# Patient Record
Sex: Female | Born: 1965 | Race: White | Hispanic: No | Marital: Married | Smoking: Current every day smoker
Health system: Southern US, Community
[De-identification: ages and names within clinical notes are randomized; demographics above are authoritative.]

## PROBLEM LIST (undated history)

## (undated) DIAGNOSIS — K8020 Calculus of gallbladder without cholecystitis without obstruction: Principal | ICD-10-CM

## (undated) DIAGNOSIS — R6883 Chills (without fever): Secondary | ICD-10-CM

## (undated) DIAGNOSIS — R509 Fever, unspecified: Secondary | ICD-10-CM

## (undated) DIAGNOSIS — R42 Dizziness and giddiness: Secondary | ICD-10-CM

## (undated) DIAGNOSIS — F419 Anxiety disorder, unspecified: Secondary | ICD-10-CM

## (undated) DIAGNOSIS — F32A Depression, unspecified: Secondary | ICD-10-CM

## (undated) DIAGNOSIS — M1990 Unspecified osteoarthritis, unspecified site: Secondary | ICD-10-CM

## (undated) DIAGNOSIS — F329 Major depressive disorder, single episode, unspecified: Secondary | ICD-10-CM

## (undated) DIAGNOSIS — R112 Nausea with vomiting, unspecified: Secondary | ICD-10-CM

## (undated) DIAGNOSIS — M62830 Muscle spasm of back: Secondary | ICD-10-CM

## (undated) HISTORY — DX: Nausea with vomiting, unspecified: R11.2

## (undated) HISTORY — DX: Anxiety disorder, unspecified: F41.9

## (undated) HISTORY — DX: Dizziness and giddiness: R42

## (undated) HISTORY — DX: Calculus of gallbladder without cholecystitis without obstruction: K80.20

## (undated) HISTORY — PX: TUBAL LIGATION: SHX77

## (undated) HISTORY — DX: Chills (without fever): R68.83

## (undated) HISTORY — DX: Fever, unspecified: R50.9

## (undated) HISTORY — DX: Unspecified osteoarthritis, unspecified site: M19.90

## (undated) HISTORY — DX: Muscle spasm of back: M62.830

## (undated) HISTORY — DX: Depression, unspecified: F32.A

## (undated) HISTORY — DX: Major depressive disorder, single episode, unspecified: F32.9

---
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
## 2002-06-28 ENCOUNTER — Other Ambulatory Visit: Admission: RE | Admit: 2002-06-28 | Discharge: 2002-06-28 | Payer: Self-pay | Admitting: Obstetrics and Gynecology

## 2003-03-14 ENCOUNTER — Encounter: Admission: RE | Admit: 2003-03-14 | Discharge: 2003-03-14 | Payer: Self-pay | Admitting: Family Medicine

## 2004-05-02 ENCOUNTER — Emergency Department (HOSPITAL_COMMUNITY): Admission: EM | Admit: 2004-05-02 | Discharge: 2004-05-02 | Payer: Self-pay | Admitting: Family Medicine

## 2004-05-03 ENCOUNTER — Emergency Department (HOSPITAL_COMMUNITY): Admission: EM | Admit: 2004-05-03 | Discharge: 2004-05-03 | Payer: Self-pay | Admitting: Family Medicine

## 2004-05-06 ENCOUNTER — Encounter: Admission: RE | Admit: 2004-05-06 | Discharge: 2004-05-06 | Payer: Self-pay | Admitting: Family Medicine

## 2007-10-04 ENCOUNTER — Encounter (INDEPENDENT_AMBULATORY_CARE_PROVIDER_SITE_OTHER): Payer: Self-pay | Admitting: Diagnostic Radiology

## 2007-10-04 ENCOUNTER — Encounter: Admission: RE | Admit: 2007-10-04 | Discharge: 2007-10-04 | Payer: Self-pay | Admitting: Obstetrics and Gynecology

## 2008-04-26 ENCOUNTER — Encounter: Admission: RE | Admit: 2008-04-26 | Discharge: 2008-04-26 | Payer: Self-pay | Admitting: Obstetrics and Gynecology

## 2011-01-06 ENCOUNTER — Encounter: Payer: Self-pay | Admitting: *Deleted

## 2011-01-06 ENCOUNTER — Emergency Department (HOSPITAL_COMMUNITY)
Admission: EM | Admit: 2011-01-06 | Discharge: 2011-01-07 | Discharge status: Against Medical Advice | Payer: 59 | Attending: Emergency Medicine | Admitting: Emergency Medicine

## 2011-01-06 ENCOUNTER — Emergency Department (HOSPITAL_COMMUNITY): Payer: 59

## 2011-01-06 ENCOUNTER — Other Ambulatory Visit: Payer: Self-pay

## 2011-01-06 DIAGNOSIS — R079 Chest pain, unspecified: Secondary | ICD-10-CM | POA: Insufficient documentation

## 2011-01-06 MED ORDER — ASPIRIN 325 MG PO TABS: 325.0000 mg | ORAL_TABLET | ORAL | Status: DC

## 2011-01-06 NOTE — ED Notes (Signed)
To ed for eval of midsternal cp. Pain is worse with moving. States she also has spasms in her back. Vomiting last night. Skin w/d, resp e/u. Pt states if she is laying curled up that her pain is better than if she is laying flat.

## 2011-01-06 NOTE — ED Notes (Signed)
Pt left AMA °

## 2011-01-09 ENCOUNTER — Other Ambulatory Visit: Payer: Self-pay | Admitting: Gastroenterology

## 2011-01-09 DIAGNOSIS — R109 Unspecified abdominal pain: Secondary | ICD-10-CM

## 2011-01-10 ENCOUNTER — Ambulatory Visit
Admission: RE | Admit: 2011-01-10 | Discharge: 2011-01-10 | Disposition: A | Discharge status: Home / Self Care | Payer: 59 | Source: Ambulatory Visit | Attending: Gastroenterology | Admitting: Gastroenterology

## 2011-01-10 DIAGNOSIS — R109 Unspecified abdominal pain: Secondary | ICD-10-CM

## 2011-01-14 ENCOUNTER — Ambulatory Visit (INDEPENDENT_AMBULATORY_CARE_PROVIDER_SITE_OTHER): Payer: 59 | Admitting: Surgery

## 2011-01-14 ENCOUNTER — Encounter (INDEPENDENT_AMBULATORY_CARE_PROVIDER_SITE_OTHER): Payer: Self-pay | Admitting: General Surgery

## 2011-01-14 ENCOUNTER — Inpatient Hospital Stay (HOSPITAL_COMMUNITY)
Admission: AD | Admit: 2011-01-14 | Discharge: 2011-01-17 | DRG: 419 | Disposition: A | Discharge status: Home / Self Care | Payer: 59 | Source: Ambulatory Visit | Attending: Surgery | Admitting: Surgery

## 2011-01-14 ENCOUNTER — Encounter (INDEPENDENT_AMBULATORY_CARE_PROVIDER_SITE_OTHER): Payer: Self-pay | Admitting: Surgery

## 2011-01-14 VITALS — BP 126/82 | HR 68 | Temp 97.9°F | Resp 16 | Ht 65.0 in | Wt 190.6 lb

## 2011-01-14 DIAGNOSIS — K8020 Calculus of gallbladder without cholecystitis without obstruction: Secondary | ICD-10-CM

## 2011-01-14 DIAGNOSIS — K8000 Calculus of gallbladder with acute cholecystitis without obstruction: Principal | ICD-10-CM | POA: Diagnosis present

## 2011-01-14 HISTORY — DX: Calculus of gallbladder without cholecystitis without obstruction: K80.20

## 2011-01-14 LAB — DIFFERENTIAL
Basophils Relative: 1 % (ref 0–1)
Eosinophils Absolute: 0.5 10*3/uL (ref 0.0–0.7)
Monocytes Absolute: 0.8 10*3/uL (ref 0.1–1.0)
Monocytes Relative: 7 % (ref 3–12)

## 2011-01-14 LAB — COMPREHENSIVE METABOLIC PANEL
AST: 22 U/L (ref 0–37)
Albumin: 3.5 g/dL (ref 3.5–5.2)
Alkaline Phosphatase: 85 U/L (ref 39–117)
Chloride: 100 mEq/L (ref 96–112)
Potassium: 3.1 mEq/L — ABNORMAL LOW (ref 3.5–5.1)
Sodium: 136 mEq/L (ref 135–145)
Total Bilirubin: 0.2 mg/dL — ABNORMAL LOW (ref 0.3–1.2)

## 2011-01-14 LAB — CBC
Platelets: 447 10*3/uL — ABNORMAL HIGH (ref 150–400)
RDW: 13.2 % (ref 11.5–15.5)
WBC: 12.3 10*3/uL — ABNORMAL HIGH (ref 4.0–10.5)

## 2011-01-14 MED ORDER — DEXTROSE 5 % IV SOLN
2.0000 g | Freq: Three times a day (TID) | INTRAVENOUS | Status: AC
  Administered 2011-01-14 – 2011-01-15 (×2): 2 g via INTRAVENOUS
  Filled 2011-01-14: qty 2

## 2011-01-14 MED ORDER — MORPHINE SULFATE 2 MG/ML IJ SOLN
2.0000 mg | INTRAMUSCULAR | Status: DC | PRN
  Administered 2011-01-14 – 2011-01-16 (×2): 2 mg via INTRAVENOUS
  Filled 2011-01-14 (×3): qty 1

## 2011-01-14 NOTE — Patient Instructions (Signed)
We will admit you to [facility] today for surgery to remove your gallbladder tomorrow. We want to start some antibiotics today

## 2011-01-14 NOTE — Progress Notes (Addendum)
Patient ID: Rebecca Wyatt, female   DOB: [birth year], 45 y.o.   MRN: [id number] Referred by Dr Dulce Sellar  Chief Complaint  Patient presents with  . Other    Eval gallbladder    HPI Rebecca Wyatt is a 45 y.o. female.  Dr Dulce Sellar has asked me to see her for gallstones. Her history is noted in his office notes, but she had significant abd pain and was found to have gallstones with an elevated WBC. There was a question of mimimal thickening of the gall bladder but a negatvie ultrasonic Murphy's was noted. She has continued woth pain and nausea. She says she was tender when the ultrasound probe was over the RUX. She has not have fever, chills or juandice. He pain continues today and she has been limiting her PO intake to avoid nauseaHPI  Past Medical History  Diagnosis Date  . Arthritis   . Abdominal pain   . Chills   . Fever   . Back spasm   . Nausea & vomiting   . Dizziness   . Light-headedness   . Gallstones 01/14/2011    Past Surgical History  Procedure Date  . Appendectomy 1982    Family History  Problem Relation Age of Onset  . COPD Mother   . Heart disease Mother   . Cancer Father     lung  . Cancer Maternal Aunt     bone, breast  . Cancer Maternal Uncle     lung  . Cancer Paternal Grandmother     breast    Social History History  Substance Use Topics  . Smoking status: Current Everyday Smoker -- 0.5 packs/day  . Smokeless tobacco: Never Used  . Alcohol Use: No    No Known Allergies  Current Outpatient Prescriptions  Medication Sig Dispense Refill  . HYDROcodone-acetaminophen (VICODIN) 5-500 MG per tablet Take 1 tablet by mouth every 6 (six) hours as needed.        Marland Kitchen alum & mag hydroxide-simeth (MAALOX/MYLANTA) 200-200-20 MG/5ML suspension Take 5 mLs by mouth every 6 (six) hours as needed. For upset stomach       . cyclobenzaprine (FLEXERIL) 10 MG tablet Take 10 mg by mouth 3 (three) times daily as needed. For muscle spasms        . omeprazole (PRILOSEC) 20  MG capsule Take 20 mg by mouth daily.        . ranitidine (ZANTAC) 75 MG tablet Take 75 mg by mouth 2 (two) times daily.          Review of Systems Review of Systems  Constitutional: Negative for fever, chills and unexpected weight change.  HENT: Negative for hearing loss, congestion, sore throat, trouble swallowing and voice change.   Eyes: Negative for visual disturbance.  Respiratory: Negative for cough and wheezing.   Cardiovascular: Negative for chest pain, palpitations and leg swelling.  Gastrointestinal: Positive for nausea, vomiting and abdominal pain. Negative for diarrhea, constipation, blood in stool, abdominal distention and anal bleeding.  Genitourinary: Negative for hematuria, vaginal bleeding and difficulty urinating.  Musculoskeletal: Negative for arthralgias.  Skin: Negative for rash and wound.  Neurological: Negative for seizures, syncope and headaches.  Hematological: Negative for adenopathy. Does not bruise/bleed easily.  Psychiatric/Behavioral: Negative for confusion.    Blood pressure 126/82, pulse 68, temperature 97.9 F (36.6 C), temperature source Temporal, resp. rate 16, height 5\' 5"  (1.651 m), weight 190 lb 9.6 oz (86.456 kg).  Physical Exam Physical Exam  Constitutional: She is oriented to person, place, and  time. She appears well-developed and well-nourished. No distress.  HENT:  Head: Normocephalic and atraumatic.  Mouth/Throat: Oropharynx is clear and moist.  Eyes: EOM are normal. Pupils are equal, round, and reactive to light. Right eye exhibits no discharge. No scleral icterus.  Neck: Normal range of motion. Neck supple. No JVD present. No tracheal deviation present. No thyromegaly present.  Cardiovascular: Normal rate, regular rhythm, normal heart sounds and intact distal pulses.  Exam reveals no gallop and no friction rub.   No murmur heard. Pulmonary/Chest: Effort normal and breath sounds normal. No respiratory distress. She has no wheezes. She has  no rales.  Abdominal: Bowel sounds are normal. She exhibits no distension. There is tenderness. There is guarding.  Musculoskeletal: She exhibits no edema and no tenderness.  Neurological: She is alert and oriented to person, place, and time.  Skin: Skin is dry. She is not diaphoretic. No erythema. No pallor.  Psychiatric: She has a normal mood and affect. Her behavior is normal.  She is very tender in the RUQ, no rebound  Data Reviewed Notes from Dr Dulce Sellar, the labs and sono note  Assessment    Acute calculous cholecystitis    Plan    Believe she needs to be admitted and undergo cholecystectomy.       Molly Maselli J 01/14/2011, 3:31 PM

## 2011-01-15 ENCOUNTER — Inpatient Hospital Stay (HOSPITAL_COMMUNITY): Payer: 59

## 2011-01-15 ENCOUNTER — Encounter (HOSPITAL_COMMUNITY): Payer: Self-pay | Admitting: Anesthesiology

## 2011-01-15 ENCOUNTER — Inpatient Hospital Stay (HOSPITAL_COMMUNITY): Payer: 59 | Admitting: Anesthesiology

## 2011-01-15 ENCOUNTER — Encounter (HOSPITAL_COMMUNITY): Admission: AD | Disposition: A | Discharge status: Home / Self Care | Payer: Self-pay | Source: Ambulatory Visit

## 2011-01-15 DIAGNOSIS — K8000 Calculus of gallbladder with acute cholecystitis without obstruction: Secondary | ICD-10-CM

## 2011-01-15 HISTORY — PX: CHOLECYSTECTOMY: SHX55

## 2011-01-15 LAB — SURGICAL PCR SCREEN
MRSA, PCR: INVALID — AB
Staphylococcus aureus: INVALID — AB

## 2011-01-15 SURGERY — LAPAROSCOPIC CHOLECYSTECTOMY WITH INTRAOPERATIVE CHOLANGIOGRAM
Anesthesia: General | Site: Abdomen | Wound class: Clean Contaminated

## 2011-01-15 MED ORDER — ONDANSETRON HCL 4 MG/2ML IJ SOLN
INTRAMUSCULAR | Status: DC | PRN
  Administered 2011-01-15: 4 mg via INTRAVENOUS

## 2011-01-15 MED ORDER — ACETAMINOPHEN 325 MG PO TABS: 650.0000 mg | ORAL_TABLET | ORAL | Status: DC | PRN

## 2011-01-15 MED ORDER — LACTATED RINGERS IV SOLN
INTRAVENOUS | Status: DC
  Administered 2011-01-15 (×2): via INTRAVENOUS
  Administered 2011-01-15: 1000 mL via INTRAVENOUS

## 2011-01-15 MED ORDER — CHLORHEXIDINE GLUCONATE CLOTH 2 % EX PADS
6.0000 | MEDICATED_PAD | Freq: Every day | CUTANEOUS | Status: DC
  Administered 2011-01-16: 6 via TOPICAL

## 2011-01-15 MED ORDER — KETOROLAC TROMETHAMINE 30 MG/ML IJ SOLN
15.0000 mg | Freq: Once | INTRAMUSCULAR | Status: AC | PRN
  Administered 2011-01-15: 30 mg via INTRAVENOUS

## 2011-01-15 MED ORDER — ACETAMINOPHEN 10 MG/ML IV SOLN
INTRAVENOUS | Status: DC | PRN
  Administered 2011-01-15: 1000 mg via INTRAVENOUS

## 2011-01-15 MED ORDER — LACTATED RINGERS IV SOLN
INTRAVENOUS | Status: DC | PRN
  Administered 2011-01-15: 1000 mL via INTRAVENOUS

## 2011-01-15 MED ORDER — MIDAZOLAM HCL 5 MG/5ML IJ SOLN
INTRAMUSCULAR | Status: DC | PRN
  Administered 2011-01-15: 2 mg via INTRAVENOUS

## 2011-01-15 MED ORDER — PIPERACILLIN-TAZOBACTAM 3.375 G IVPB
3.3750 g | Freq: Three times a day (TID) | INTRAVENOUS | Status: DC
  Administered 2011-01-15 – 2011-01-17 (×6): 3.375 g via INTRAVENOUS
  Filled 2011-01-15 (×9): qty 50

## 2011-01-15 MED ORDER — KCL IN DEXTROSE-NACL 20-5-0.45 MEQ/L-%-% IV SOLN
INTRAVENOUS | Status: DC
  Administered 2011-01-15 – 2011-01-16 (×2): via INTRAVENOUS
  Filled 2011-01-15 (×6): qty 1000

## 2011-01-15 MED ORDER — BUPIVACAINE-EPINEPHRINE 0.25% -1:200000 IJ SOLN
INTRAMUSCULAR | Status: DC | PRN
  Administered 2011-01-15: 20 mL

## 2011-01-15 MED ORDER — FENTANYL CITRATE 0.05 MG/ML IJ SOLN: 25.0000 ug | INTRAMUSCULAR | Status: DC | PRN

## 2011-01-15 MED ORDER — DEXAMETHASONE SODIUM PHOSPHATE 10 MG/ML IJ SOLN
INTRAMUSCULAR | Status: DC | PRN
  Administered 2011-01-15: 10 mg via INTRAVENOUS

## 2011-01-15 MED ORDER — ONDANSETRON HCL 4 MG PO TABS: 4.0000 mg | ORAL_TABLET | Freq: Four times a day (QID) | ORAL | Status: DC | PRN

## 2011-01-15 MED ORDER — CISATRACURIUM BESYLATE 2 MG/ML IV SOLN
INTRAVENOUS | Status: DC | PRN
  Administered 2011-01-15: 2 mg via INTRAVENOUS

## 2011-01-15 MED ORDER — HYDROCODONE-ACETAMINOPHEN 5-325 MG PO TABS
1.0000 | ORAL_TABLET | ORAL | Status: DC | PRN
  Administered 2011-01-16: 2 via ORAL
  Administered 2011-01-17 (×3): 1 via ORAL
  Filled 2011-01-15: qty 2
  Filled 2011-01-15: qty 1
  Filled 2011-01-15: qty 2

## 2011-01-15 MED ORDER — NEOSTIGMINE METHYLSULFATE 1 MG/ML IJ SOLN
INTRAMUSCULAR | Status: DC | PRN
  Administered 2011-01-15: 4 mg via INTRAVENOUS
  Administered 2011-01-15: 1 mg via INTRAVENOUS

## 2011-01-15 MED ORDER — FENTANYL CITRATE 0.05 MG/ML IJ SOLN
INTRAMUSCULAR | Status: DC | PRN
  Administered 2011-01-15: 50 ug via INTRAVENOUS
  Administered 2011-01-15 (×2): 100 ug via INTRAVENOUS
  Administered 2011-01-15: 50 ug via INTRAVENOUS

## 2011-01-15 MED ORDER — MORPHINE SULFATE 2 MG/ML IJ SOLN
2.0000 mg | INTRAMUSCULAR | Status: DC | PRN
  Administered 2011-01-15 – 2011-01-16 (×11): 2 mg via INTRAVENOUS
  Filled 2011-01-15 (×10): qty 1

## 2011-01-15 MED ORDER — GLYCOPYRROLATE 0.2 MG/ML IJ SOLN
INTRAMUSCULAR | Status: DC | PRN
  Administered 2011-01-15: .6 mg via INTRAVENOUS

## 2011-01-15 MED ORDER — ONDANSETRON HCL 4 MG/2ML IJ SOLN
4.0000 mg | Freq: Four times a day (QID) | INTRAMUSCULAR | Status: DC | PRN
  Administered 2011-01-15: 4 mg via INTRAVENOUS
  Filled 2011-01-15: qty 2

## 2011-01-15 MED ORDER — HYDROMORPHONE HCL PF 1 MG/ML IJ SOLN
INTRAMUSCULAR | Status: DC | PRN
  Administered 2011-01-15: 1 mg via INTRAVENOUS
  Administered 2011-01-15: 0.5 mg via INTRAVENOUS
  Administered 2011-01-15: 1 mg via INTRAVENOUS
  Administered 2011-01-15: 0.5 mg via INTRAVENOUS
  Administered 2011-01-15: 1 mg via INTRAVENOUS

## 2011-01-15 MED ORDER — ROCURONIUM BROMIDE 100 MG/10ML IV SOLN
INTRAVENOUS | Status: DC | PRN
  Administered 2011-01-15: 10 mg via INTRAVENOUS
  Administered 2011-01-15: 5 mg via INTRAVENOUS
  Administered 2011-01-15: 10 mg via INTRAVENOUS
  Administered 2011-01-15: 40 mg via INTRAVENOUS

## 2011-01-15 MED ORDER — LIDOCAINE HCL (CARDIAC) 20 MG/ML IV SOLN
INTRAVENOUS | Status: DC | PRN
  Administered 2011-01-15: 50 mg via INTRAVENOUS

## 2011-01-15 MED ORDER — MUPIROCIN 2 % EX OINT
1.0000 "application " | TOPICAL_OINTMENT | Freq: Two times a day (BID) | CUTANEOUS | Status: DC
  Administered 2011-01-16 (×2): 1 via NASAL
  Filled 2011-01-15 (×2): qty 22

## 2011-01-15 MED ORDER — IOHEXOL 300 MG/ML  SOLN
INTRAMUSCULAR | Status: DC | PRN
  Administered 2011-01-15 (×2): 6 mL via INTRAVENOUS

## 2011-01-15 MED ORDER — PROPOFOL 10 MG/ML IV BOLUS
INTRAVENOUS | Status: DC | PRN
  Administered 2011-01-15: 20 mg via INTRAVENOUS
  Administered 2011-01-15: 180 mg via INTRAVENOUS

## 2011-01-15 SURGICAL SUPPLY — 44 items
APL SKNCLS STERI-STRIP NONHPOA (GAUZE/BANDAGES/DRESSINGS) ×1
APPLIER CLIP ROT 10 11.4 M/L (STAPLE) ×2
APR CLP MED LRG 11.4X10 (STAPLE) ×1
BAG SPEC RTRVL LRG 6X4 10 (ENDOMECHANICALS) ×1
BENZOIN TINCTURE PRP APPL 2/3 (GAUZE/BANDAGES/DRESSINGS) ×2 IMPLANT
CANISTER SUCTION 2500CC (MISCELLANEOUS) ×2 IMPLANT
CLIP APPLIE ROT 10 11.4 M/L (STAPLE) ×1 IMPLANT
CLOSURE STERI STRIP 1/2 X4 (GAUZE/BANDAGES/DRESSINGS) ×1 IMPLANT
CLOTH BEACON ORANGE TIMEOUT ST (SAFETY) ×2 IMPLANT
COVER MAYO STAND STRL (DRAPES) ×2 IMPLANT
DECANTER SPIKE VIAL GLASS SM (MISCELLANEOUS) ×2 IMPLANT
DRAIN CHANNEL RND F F (WOUND CARE) ×1 IMPLANT
DRAPE C-ARM 42X72 X-RAY (DRAPES) ×2 IMPLANT
DRAPE LAPAROSCOPIC ABDOMINAL (DRAPES) ×2 IMPLANT
ELECT REM PT RETURN 9FT ADLT (ELECTROSURGICAL) ×2
ELECTRODE REM PT RTRN 9FT ADLT (ELECTROSURGICAL) ×1 IMPLANT
EVACUATOR DRAINAGE 7X20 100CC (MISCELLANEOUS) IMPLANT
EVACUATOR SILICONE 100CC (MISCELLANEOUS) ×2
GAUZE SPONGE 4X4 12PLY STRL LF (GAUZE/BANDAGES/DRESSINGS) ×1 IMPLANT
GLOVE BIOGEL PI IND STRL 7.0 (GLOVE) ×1 IMPLANT
GLOVE BIOGEL PI INDICATOR 7.0 (GLOVE) ×1
GLOVE ORTHO TXT STRL SZ7.5 (GLOVE) ×2 IMPLANT
GOWN PREVENTION PLUS XLARGE (GOWN DISPOSABLE) ×2 IMPLANT
GOWN STRL NON-REIN LRG LVL3 (GOWN DISPOSABLE) ×2 IMPLANT
GOWN STRL REIN XL XLG (GOWN DISPOSABLE) ×2 IMPLANT
HEMOSTAT SNOW SURGICEL 2X4 (HEMOSTASIS) ×1 IMPLANT
HEMOSTAT SURGICEL 4X8 (HEMOSTASIS) IMPLANT
IV NS 1000ML (IV SOLUTION) ×6
IV NS 1000ML BAXH (IV SOLUTION) ×3 IMPLANT
KIT BASIN OR (CUSTOM PROCEDURE TRAY) ×2 IMPLANT
NS IRRIG 1000ML POUR BTL (IV SOLUTION) IMPLANT
POUCH SPECIMEN RETRIEVAL 10MM (ENDOMECHANICALS) ×2 IMPLANT
SET CHOLANGIOGRAPH MIX (MISCELLANEOUS) ×2 IMPLANT
SET IRRIG TUBING LAPAROSCOPIC (IRRIGATION / IRRIGATOR) ×2 IMPLANT
SOLUTION ANTI FOG 6CC (MISCELLANEOUS) ×2 IMPLANT
STRIP CLOSURE SKIN 1/2X4 (GAUZE/BANDAGES/DRESSINGS) ×2 IMPLANT
SUT VIC AB 0 UR5 27 (SUTURE) IMPLANT
SUT VIC AB 4-0 PS2 27 (SUTURE) ×2 IMPLANT
TAPE HYPAFIX 4 X10 (GAUZE/BANDAGES/DRESSINGS) ×1 IMPLANT
TRAY LAP CHOLE (CUSTOM PROCEDURE TRAY) ×2 IMPLANT
TROCAR BLADELESS OPT 5 75 (ENDOMECHANICALS) ×4 IMPLANT
TROCAR XCEL BLUNT TIP 100MML (ENDOMECHANICALS) ×2 IMPLANT
TROCAR XCEL NON-BLD 11X100MML (ENDOMECHANICALS) ×2 IMPLANT
TUBING INSUFFLATION 10FT LAP (TUBING) ×2 IMPLANT

## 2011-01-15 NOTE — Anesthesia Postprocedure Evaluation (Signed)
  Anesthesia Post-op Note  Patient: Rebecca Wyatt  Procedure(s) Performed:  LAPAROSCOPIC CHOLECYSTECTOMY WITH INTRAOPERATIVE CHOLANGIOGRAM  Patient Location: PACU  Anesthesia Type: General  Level of Consciousness: awake and alert   Airway and Oxygen Therapy: Patient Spontanous Breathing  Post-op Pain: mild  Post-op Assessment: Post-op Vital signs reviewed, Patient's Cardiovascular Status Stable, Respiratory Function Stable, Patent Airway and No signs of Nausea or vomiting  Post-op Vital Signs: stable  Complications: No apparent anesthesia complications

## 2011-01-15 NOTE — Preoperative (Signed)
Beta Blockers   Reason not to administer Beta Blockers:Not Applicable 

## 2011-01-15 NOTE — Anesthesia Procedure Notes (Signed)
Procedures

## 2011-01-15 NOTE — Brief Op Note (Signed)
01/14/2011 - 01/15/2011  3:49 PM  PATIENT:  Arn Medal  45 y.o. female  PRE-OPERATIVE DIAGNOSIS:  accute cholelithiasis  POST-OPERATIVE DIAGNOSIS:  accute cholelithiasis  PROCEDURE:  Procedure(s): LAPAROSCOPIC CHOLECYSTECTOMY WITH INTRAOPERATIVE CHOLANGIOGRAM  SURGEON:  Surgeon(s): Iona Coach, MD  PHYSICIAN ASSISTANT: Brayton El  ASSISTANTS: none   ANESTHESIA:   general  EBL:  Total I/O In: 2400 [I.V.:2300; IV Piggyback:100] Out: -   BLOOD ADMINISTERED:none  DRAINS: (19) Blake drain(s) in the subhepatic fossa   LOCAL MEDICATIONS USED:  MARCAINE 20CC  SPECIMEN:  Excision  DISPOSITION OF SPECIMEN:  PATHOLOGY  COUNTS:  YES  TOURNIQUET:  * No tourniquets in log *  DICTATION: .Other Dictation: Dictation Number [id number]  PLAN OF CARE: Admit for overnight observation  PATIENT DISPOSITION:  PACU - hemodynamically stable.

## 2011-01-15 NOTE — Anesthesia Preprocedure Evaluation (Addendum)
Anesthesia Evaluation  Patient identified by MRN, date of birth, ID band Patient awake    Reviewed: Allergy & Precautions, H&P , NPO status , Patient's Chart, lab work & pertinent test results  Airway Mallampati: II TM Distance: >3 FB Neck ROM: Full    Dental No notable dental hx. (+) Dental Advisory Given,    Pulmonary neg pulmonary ROS,  clear to auscultation  Pulmonary exam normal       Cardiovascular neg cardio ROS Regular Normal    Neuro/Psych Negative Neurological ROS  Negative Psych ROS   GI/Hepatic negative GI ROS, Neg liver ROS, GERD-  ,  Endo/Other  Negative Endocrine ROS  Renal/GU negative Renal ROS  Genitourinary negative   Musculoskeletal negative musculoskeletal ROS (+)   Abdominal   Peds negative pediatric ROS (+)  Hematology negative hematology ROS (+)   Anesthesia Other Findings   Reproductive/Obstetrics negative OB ROS                          Anesthesia Physical Anesthesia Plan  ASA: II  Anesthesia Plan: General   Post-op Pain Management:    Induction: Intravenous  Airway Management Planned: Oral ETT  Additional Equipment:   Intra-op Plan:   Post-operative Plan: Extubation in OR  Informed Consent: I have reviewed the patients History and Physical, chart, labs and discussed the procedure including the risks, benefits and alternatives for the proposed anesthesia with the patient or authorized representative who has indicated his/her understanding and acceptance.   Dental advisory given  Plan Discussed with: CRNA  Anesthesia Plan Comments:         Anesthesia Quick Evaluation

## 2011-01-15 NOTE — Transfer of Care (Signed)
Immediate Anesthesia Transfer of Care Note  Patient: Rebecca Wyatt  Procedure(s) Performed:  LAPAROSCOPIC CHOLECYSTECTOMY WITH INTRAOPERATIVE CHOLANGIOGRAM  Patient Location: PACU  Anesthesia Type: General  Level of Consciousness: sedated, patient cooperative, lethargic and responds to stimulation  Airway & Oxygen Therapy: Patient Spontanous Breathing and Patient connected to face mask oxygen  Post-op Assessment: Report given to PACU RN, Post -op Vital signs reviewed and stable and Patient moving all extremities X 4  Post vital signs: Reviewed and stable  Complications: No apparent anesthesia complications

## 2011-01-15 NOTE — Progress Notes (Signed)
  Subjective: Cholecystitis npo or this am pt agrees. Permit signed  Objective: Vital signs in last 24 hours: Temp:  [97.9 F (36.6 C)-98.2 F (36.8 C)] 98.2 F (36.8 C) (12/12 0648) Pulse Rate:  [68-91] 86  (12/12 0648) Resp:  [16-18] 18  (12/12 0648) BP: (107-129)/(59-82) 112/59 mmHg (12/12 0648) SpO2:  [96 %-99 %] 96 % (12/12 0648) Weight:  [189 lb 2.5 oz (85.8 kg)-190 lb 9.6 oz (86.456 kg)] 189 lb 2.5 oz (85.8 kg) (12/11 1851) Last BM Date: 01/14/11  Intake/Output this shift:    Physical Exam: BP 112/59  Pulse 86  Temp(Src) 98.2 F (36.8 C) (Oral)  Resp 18  Ht 5\' 5"  (1.651 m)  Wt 189 lb 2.5 oz (85.8 kg)  BMI 31.48 kg/m2  SpO2 96% Mild tenderness RUQ  Labs: CBC  Basename 01/14/11 2008  WBC 12.3*  HGB 14.2  HCT 41.9  PLT 447*   BMET  Basename 01/14/11 2008  NA 136  K 3.1*  CL 100  CO2 25  GLUCOSE 93  BUN 8  CREATININE 0.71  CALCIUM 10.2   LFT  Basename 01/14/11 2008  PROT 8.1  ALBUMIN 3.5  AST 22  ALT 26  ALKPHOS 85  BILITOT 0.2*  BILIDIR --  IBILI --  LIPASE 35   PT/INR No results found for this basename: LABPROT:2,INR:2 in the last 72 hours ABG No results found for this basename: PHART:2,PCO2:2,PO2:2,HCO3:2 in the last 72 hours  Studies/Results: No results found.  Assessment: Active Problems:  * No active hospital problems. *    Procedure(s): LAPAROSCOPIC CHOLECYSTECTOMY WITH INTRAOPERATIVE CHOLANGIOGRAM  Plan:  or this am plan cholangiogram  LOS: 1 day    Sandra Tellefsen J 01/15/2011

## 2011-01-16 ENCOUNTER — Other Ambulatory Visit (INDEPENDENT_AMBULATORY_CARE_PROVIDER_SITE_OTHER): Payer: Self-pay | Admitting: General Surgery

## 2011-01-16 ENCOUNTER — Encounter (HOSPITAL_COMMUNITY): Payer: Self-pay | Admitting: General Surgery

## 2011-01-16 LAB — CBC
HCT: 36.7 % (ref 36.0–46.0)
MCH: 26.6 pg (ref 26.0–34.0)
MCV: 82.7 fL (ref 78.0–100.0)
Platelets: 414 10*3/uL — ABNORMAL HIGH (ref 150–400)
RBC: 4.44 MIL/uL (ref 3.87–5.11)

## 2011-01-16 LAB — COMPREHENSIVE METABOLIC PANEL
AST: 47 U/L — ABNORMAL HIGH (ref 0–37)
Albumin: 2.8 g/dL — ABNORMAL LOW (ref 3.5–5.2)
Alkaline Phosphatase: 70 U/L (ref 39–117)
Chloride: 99 mEq/L (ref 96–112)
Potassium: 3.8 mEq/L (ref 3.5–5.1)
Total Bilirubin: 0.2 mg/dL — ABNORMAL LOW (ref 0.3–1.2)

## 2011-01-16 MED ORDER — METHOCARBAMOL 500 MG PO TABS
500.0000 mg | ORAL_TABLET | Freq: Four times a day (QID) | ORAL | Status: DC | PRN
  Administered 2011-01-16 – 2011-01-17 (×2): 500 mg via ORAL
  Filled 2011-01-16 (×2): qty 1

## 2011-01-16 NOTE — Op Note (Signed)
NAMEABBIE, BERLING NO.:  [id number]  MEDICAL RECORD NO.:  [id number]  LOCATION:  1510                         FACILITY:  [facility]  PHYSICIAN:  Anselm Pancoast. Shahad Mazurek, M.D.DATE OF BIRTH:  February 04, 1966  DATE OF PROCEDURE:  01/15/2011 DATE OF DISCHARGE:                              OPERATIVE REPORT   PREOPERATIVE DIAGNOSIS:  Acute cholecystitis with stones.  POSTOPERATIVE DIAGNOSES: 1. Acute cholecystitis with stones. 2. Negative cholangiogram.  OPERATION:  Laparoscopic cholecystectomy with cholangiogram.  ANESTHESIA:  General anesthesia.  SURGEON:  Anselm Pancoast. Zachery Dakins, M.D.  ASSISTANT:  Festus Barren.  HISTORY:  The patient is a 45 year old female who was seen in the Urgent Office yesterday by Dr. Jamey Ripa and gave a history that she had significant abdominal pain last week.  She saw a physician and I think that they did a white count that was markedly elevated, but the diagnosis of acute cholecystitis was not made until she had an ultrasound of the gallbladder, I think on Monday.  They referred her to our Urgent Clinic.  She was seen on Tuesday and Dr. Jamey Ripa found that she was definitely tender in the right upper quadrant, and arranged for her to be admitted at [facility] and start on IV fluids, antibiotics. He had contacted me about adding her to the OR schedule for the day, and this morning when I examined her, she was definitely tender in the right upper quadrant.  She was not febrile and understood that we will hopefully be able to do this with the laparoscope.  The patient has had a previous tubal procedure of some sort, but not actually truly had a upper abdominal surgery and was in agreement for the surgery.  She was taken down to __________ gallbladder today and the time-out was completed.  She was induced general anesthesia by Dr. Jenean Lindau by endotracheal tube, oral tube into the stomach, and then the abdomen was prepped with Betadine solution and  draped in a sterile manner.  Time-out was completed and appropriate documentation, etc., were done and the antibiotics were being administered and she has PAS stockings.  After this was completed, a small incision was made below the umbilicus.  She has had a previous laparoscopic __________ GYN procedure and we carefully dissected down through the fascia.  She is about 190 pounds and opened the fascia using tip of my fingers __________ by work into the right side.  I could identify the free peritoneal cavity.  A pursestring suture of 0 Vicryl was placed and a Hasson cannula inserted. The camera inserted and CO2 was infused.  The gallbladder, you could see the gallbladder, which you could see a big inflammatory mass in the subhepatic area.  There was no evidence of any generalized peritonitis, etc., and the upper 10 mm trocar was placed in the subxiphoid to the right side, 2 lateral 5 mm trocars were placed at the appropriate level of position.  We spent the next hour trying to basically free up the omentum that was very adherent and kind of carefully working down on the gallbladder and had switched to a 30-degree scope, and then at the more proximal portion of the gallbladder, things were __________.  I  had aspirated the bile from the gallbladder with a __________ aspirator, but still __________decompressed it was very difficult to get a grasper to actually grab this markedly inflamed hemorrhagic, nearly necrotic gallbladder.  The proximal portion medially very carefully dissected and then at the most deepened portion, you could visualize what looks like the cystic duct, kind of curved back on itself and I was able to encompass it after I kind of freed up the attachments to the liver laterally and then I could get a right-angle around the cystic duct. There were numerous little vessels that were not really a large major cystic duct or the cystic artery at all, and we put clips once of  any size and we cauterized the little areas that were probably lymphatic.  I then did a cholangiogram and the cholangiogram shows that she has got about a 3 cm cystic duct, good flow into the duodenum.  No evidence of any common duct stones and we then removed the catheter, dissected with about a centimeter more proximal, since we knew the structures and could put 3 clips on the proximal cystic duct, then divided it.  I then kind of teased the gallbladder inferiorly, being very cautious since we had not actually clipped or ligated a significant cystic artery, and then we could start get it in the septic areas and the gallbladder itself was just basically obliterated with its attachment to the liver and then a couple areas we actually got into the gallbladder because it had undergone intrahepatic, but since we had aspirated the bile.  There was very little bile spillage, that the stones were big, so there was not any dropped stones.  We were finally able to free up the gallbladder completely using predominantly the __________ and then placed the gallbladder in the EndoCatch bag.  I then thoroughly irrigated and aspirated the cystic duct.  Clamps were in good position.  No evidence of any significant bleeding.  A piece of snow was placed and then we placed a piece of snow Surgicel product actually in the gallbladder fossa and then switched the camera to the upper tomb of the __________tying the gallbladder, pulled it up, flushed with a abdominal wall.  The stones were large enough and __________ crushed stones trying to open the gallbladder.  After we had opened it up, cystic duct, __________ easily and then we could pull the stones out and the gallbladder within the bag and set her __________ spillage.  I then replaced the pursestring suture __________ we transected the little pursestring and then reinserted the __________.  Hemostasis appeared good.  The irrigating fluid was aspirated and  brought a big 19 Blake drain laterally through the most lateral incision and placed up in the fossa with the gallbladder.  The [place] drain is under the liver and brought out laterally, and sutured to the skin and then after reinspected, it was deemed satisfactory.  I removed the umbilical Hasson cannula.  I put an additional figure-of-8, tied all __________ sutures there and then anesthetized the fascia at the umbilicus.  The 5 mm port was withdrawn under direct vision and the upper 10 mm trocar withdrawn. __________ keep her on antibiotics for about 24 hours and check liver function studies in the morning.  The Blake drains __________ 4 days ago.  The patient did not have any bile drainage from the [place] since. I think we got 3 clips on the cystic duct that was not acutely inflamed like the gallbladder was.  Anselm Pancoast. Zachery Dakins, M.D.     WJW/MEDQ  D:  01/15/2011  T:  01/15/2011  Job:  [id number]

## 2011-01-16 NOTE — Progress Notes (Signed)
INITIAL ADULT NUTRITION ASSESSMENT Date: 01/16/2011   Time: 10:32 AM Reason for Assessment: Nutrition risk   ASSESSMENT: Female 45 y.o.  Dx: Acute cholecystitis  Hx:  Past Medical History  Diagnosis Date  . Arthritis   . Abdominal pain   . Chills   . Fever   . Back spasm   . Nausea & vomiting   . Dizziness   . Light-headedness   . Gallstones 01/14/2011   Related Meds:  Scheduled Meds:   . cefOXitin  2 g Intravenous Q8H  . Chlorhexidine Gluconate Cloth  6 each Topical Q0600  . mupirocin ointment  1 application Nasal BID  . piperacillin-tazobactam (ZOSYN)  IV  3.375 g Intravenous Q8H   Continuous Infusions:   . dextrose 5 % and 0.45 % NaCl with KCl 20 mEq/L 100 mL/hr at 01/15/11 1810  . DISCONTD: lactated ringers     PRN Meds:.acetaminophen, HYDROcodone-acetaminophen, ketorolac, morphine, ondansetron (ZOFRAN) IV, ondansetron, DISCONTD: bupivacaine-EPINEPHrine, DISCONTD: fentaNYL, DISCONTD: iohexol, DISCONTD: lactated ringers, DISCONTD:  morphine injection  Ht: 5\' 5"  (165.1 cm)  Wt: 189 lb 2.5 oz (85.8 kg)  Ideal Wt: 56.8kg % Ideal Wt: 151  Usual Wt: 90kg % Usual Wt: 95  Body mass index is 31.48 kg/(m^2).  Food/Nutrition Related Hx: POD# 1 laparoscopic cholecystectomy. Pt reports poor intake for the past week r/t being afraid to eat to trigger an "attack" of intense pain, nausea, and vomiting. Pt reports unintentional 10 pound weight loss during this time. Pt reports that when she was feeling better she was eating well and following a bland diet r/t history of reflux. Pt currently denies any nausea, and reports tolerating clear liquid diet well.   Labs:  CMP     Component Value Date/Time   NA 133* 01/16/2011 0512   K 3.8 01/16/2011 0512   CL 99 01/16/2011 0512   CO2 24 01/16/2011 0512   GLUCOSE 132* 01/16/2011 0512   BUN 6 01/16/2011 0512   CREATININE 0.68 01/16/2011 0512   CALCIUM 9.6 01/16/2011 0512   PROT 6.6 01/16/2011 0512   ALBUMIN 2.8* 01/16/2011  0512   AST 47* 01/16/2011 0512   ALT 40* 01/16/2011 0512   ALKPHOS 70 01/16/2011 0512   BILITOT 0.2* 01/16/2011 0512   GFRNONAA >90 01/16/2011 0512   GFRAA >90 01/16/2011 0512    Intake/Output Summary (Last 24 hours) at 01/16/11 1038 Last data filed at 01/16/11 0700  Gross per 24 hour  Intake 4083.33 ml  Output    500 ml  Net 3583.33 ml    Diet Order: Clear Liquid  IVF:    dextrose 5 % and 0.45 % NaCl with KCl 20 mEq/L Last Rate: 100 mL/hr at 01/15/11 1810  DISCONTD: lactated ringers     Estimated Nutritional Needs:   Kcal:1559-1850 Protein:70-85g Fluid:1.5-1.8L  NUTRITION DIAGNOSIS: -Inadequate oral intake (NI-2.1).  Status: Ongoing -Pt meets criteria for severe PCM of acute illness AEB 5% weight loss and <50% energy intake for the past week per pt report   RELATED TO: nausea/vomiting/pain/recent surgery   AS EVIDENCE BY: <50% intake of clear liquid breakfast  MONITORING/EVALUATION(Goals): Advance diet as tolerated to bland diet.   EDUCATION NEEDS: -Education needs addressed. Reviewed low fat diet.   INTERVENTION: Diet advancement per MD. Encouraged increased intake as tolerated. Will monitor.   Dietitian # [phone]  DOCUMENTATION CODES Per approved criteria  -Severe malnutrition in the context of acute illness or injury -Obesity unspecified     Marshall Cork 01/16/2011, 10:32 AM

## 2011-01-16 NOTE — Progress Notes (Signed)
1 Day Post-Op  Subjective: Pt ok. Sore, but pain control ok. No n/v, wants to eat more.  Objective: Vital signs in last 24 hours: Temp:  [97.6 F (36.4 C)-98.7 F (37.1 C)] 97.6 F (36.4 C) (12/13 0530) Pulse Rate:  [76-105] 76  (12/13 0530) Resp:  [13-20] 18  (12/13 0530) BP: (116-155)/(59-78) 129/76 mmHg (12/13 0530) SpO2:  [96 %-100 %] 96 % (12/13 0530) Last BM Date: 01/14/11  Intake/Output this shift: Total I/O In: -  Out: 455 [Urine:450; Drains:5]  Physical Exam: BP 129/76  Pulse 76  Temp(Src) 97.6 F (36.4 C) (Oral)  Resp 18  Ht 5\' 5"  (1.651 m)  Wt 189 lb 2.5 oz (85.8 kg)  BMI 31.48 kg/m2  SpO2 96% Lungs: CTA without w/r/r Heart: Regular Abdomen: soft, ND, appropriately tender   Incisions all c/d/i without erythema or hematoma.   Drain intact, SS output. Ext: No edema or tenderness   Labs: CBC  Basename 01/16/11 0512 01/14/11 2008  WBC 13.7* 12.3*  HGB 11.8* 14.2  HCT 36.7 41.9  PLT 414* 447*   BMET  Basename 01/16/11 0512 01/14/11 2008  NA 133* 136  K 3.8 3.1*  CL 99 100  CO2 24 25  GLUCOSE 132* 93  BUN 6 8  CREATININE 0.68 0.71  CALCIUM 9.6 10.2   LFT  Basename 01/16/11 0512 01/14/11 2008  PROT 6.6 --  ALBUMIN 2.8* --  AST 47* --  ALT 40* --  ALKPHOS 70 --  BILITOT 0.2* --  BILIDIR -- --  IBILI -- --  LIPASE -- 35   PT/INR No results found for this basename: LABPROT:2,INR:2 in the last 72 hours ABG No results found for this basename: PHART:2,PCO2:2,PO2:2,HCO3:2 in the last 72 hours  Studies/Results: Dg Cholangiogram Operative  01/15/2011  *RADIOLOGY REPORT*  Clinical Data:   Cholelithiasis  INTRAOPERATIVE CHOLANGIOGRAM  Technique:  Cholangiographic images from the C-arm fluoroscopic device were submitted for interpretation post-operatively.  Please see the procedural report for the amount of contrast and the fluoroscopy time utilized.  Comparison:  None  Findings:  No persistent filling defects in the common duct. Intrahepatic  ducts are incompletely visualized, appearing decompressed centrally. Contrast passes into the duodenum.  IMPRESSION  Negative for retained common duct stone.  Original Report Authenticated By: Osa Craver, M.D.    Assessment: Active Problems:  Gallbladder calculus with acute cholecystitis   Procedure(s): LAPAROSCOPIC CHOLECYSTECTOMY WITH INTRAOPERATIVE CHOLANGIOGRAM  Plan: Advance diet today, encourage OOB Continue IV abx. Transition to po pain control Probably home tomorrow.  LOS: 2 days    Iona Coach 01/16/2011

## 2011-01-17 LAB — CBC
MCH: 27.4 pg (ref 26.0–34.0)
MCV: 83.9 fL (ref 78.0–100.0)
Platelets: 348 10*3/uL (ref 150–400)
RDW: 13.5 % (ref 11.5–15.5)

## 2011-01-17 MED ORDER — HYDROCODONE-ACETAMINOPHEN 5-500 MG PO TABS: 1.0000 | ORAL_TABLET | Freq: Four times a day (QID) | ORAL | Status: DC | PRN

## 2011-01-17 NOTE — Progress Notes (Signed)
Patient discharged home with family member, alert and oriented, discharge instructions given patient verbalize understanding of discharge orders, patient in stable condition at this time

## 2011-01-17 NOTE — Discharge Summary (Signed)
Physician Discharge Summary  Patient ID: Rebecca Wyatt MRN: [id number] DOB/AGE: 10/24/1965 45 y.o.  Admit date: 01/14/2011 Discharge date: 01/17/2011  Admission Diagnoses: Cholecystitis Discharge Diagnoses:  Active Problems:  Gallbladder calculus with acute cholecystitis  Procedure(s): LAPAROSCOPIC CHOLECYSTECTOMY WITH INTRAOPERATIVE CHOLANGIOGRAM  Discharged Condition: good  Hospital Course: Pt admitted from office for sxs c/w cholecystitis. She was taken to OR for lap chole which found a very acute and inflamed gallbladder. The surgery was completed laparoscopically and the pt has done well post operatively. Her WBC has returned to normal. She feels good and we feel she is appropriately stable for DC home.  Consults: none   Discharge Exam: Blood pressure 110/67, pulse 82, temperature 97.7 F (36.5 C), temperature source Oral, resp. rate 18, height 5\' 5"  (1.651 m), weight 189 lb 2.5 oz (85.8 kg), SpO2 93.00%. Lungs: CTA without w/r/r Heart: Regular Abdomen: soft, ND, appropriately tender   Incisions all c/d/i without erythema or hematoma. Ext: No edema or tenderness   Disposition: Left Against Medical Advice  Discharge Orders    Future Orders Please Complete By Expires   Diet - low sodium heart healthy      Increase activity slowly      Lifting restrictions      Comments:   No heavy lifting more than 10lbs for 2 weeks.   May shower / Bathe      Remove dressing in 24 hours      Call MD for:  redness, tenderness, or signs of infection (pain, swelling, redness, odor or green/yellow discharge around incision site)      Call MD for:  severe uncontrolled pain      Call MD for:  persistant nausea and vomiting      Call MD for:  temperature >100.4        Current Discharge Medication List    CONTINUE these medications which have CHANGED   Details  HYDROcodone-acetaminophen (VICODIN) 5-500 MG per tablet Take 1 tablet by mouth every 6 (six) hours as needed. Qty: 30  tablet, Refills: 0      CONTINUE these medications which have NOT CHANGED   Details  alum & mag hydroxide-simeth (MAALOX/MYLANTA) 200-200-20 MG/5ML suspension Take 5 mLs by mouth every 6 (six) hours as needed. For upset stomach     cyclobenzaprine (FLEXERIL) 10 MG tablet Take 10 mg by mouth 3 (three) times daily as needed. For muscle spasms      omeprazole (PRILOSEC) 20 MG capsule Take 20 mg by mouth daily.      ranitidine (ZANTAC) 75 MG tablet Take 75 mg by mouth 2 (two) times daily.      traMADol (ULTRAM) 50 MG tablet Take 50 mg by mouth every 6 (six) hours as needed. Maximum dose= 8 tablets per day        Follow-up Information    Follow up with [facility],MD on 02/05/2011. ([facility] clinic 2:50pm)    Contact information:   [facility] [address] [place] [address] [phone]          Signed: Marianna Fuss 01/17/2011, 8:44 AM

## 2011-01-18 LAB — MRSA CULTURE

## 2011-01-24 ENCOUNTER — Telehealth (INDEPENDENT_AMBULATORY_CARE_PROVIDER_SITE_OTHER): Payer: Self-pay | Admitting: General Surgery

## 2011-01-24 NOTE — Telephone Encounter (Signed)
Patient question when she can return to work. Estimate return to work on FMLA is 02/05/2011. Patient advised to return call to [place] or [place].  Dr. Jamey Ripa signed RTW letter.

## 2011-01-27 ENCOUNTER — Telehealth (INDEPENDENT_AMBULATORY_CARE_PROVIDER_SITE_OTHER): Payer: Self-pay

## 2011-01-27 NOTE — Telephone Encounter (Signed)
C/O lower right side, flank pain with deep breaths.  On set yesterday when activity was increased. She is also thinks she may have an yeast infection and has taken OTC Monistat.  On  01/15/2011 patient had Laparoscopic cholecystectomy with cholangiogram surgery.   Per Dr. Magnus Ivan if pain condition should worsen or does not get any better report back to hospital.  Patient aware.

## 2011-01-30 ENCOUNTER — Encounter (INDEPENDENT_AMBULATORY_CARE_PROVIDER_SITE_OTHER): Payer: Self-pay | Admitting: Surgery

## 2011-02-05 ENCOUNTER — Ambulatory Visit (INDEPENDENT_AMBULATORY_CARE_PROVIDER_SITE_OTHER): Payer: 59 | Admitting: Radiology

## 2011-02-05 ENCOUNTER — Encounter (INDEPENDENT_AMBULATORY_CARE_PROVIDER_SITE_OTHER): Payer: Self-pay | Admitting: Radiology

## 2011-02-05 VITALS — BP 136/84 | Ht 65.5 in | Wt 189.6 lb

## 2011-02-05 DIAGNOSIS — K819 Cholecystitis, unspecified: Secondary | ICD-10-CM

## 2011-02-05 NOTE — Progress Notes (Signed)
Rebecca Wyatt November 10, 1965 [id number] 02/05/2011   Rebecca Wyatt is a 46 y.o. female who had a laparoscopic cholecystectomy with intraoperative cholangiogram.  The pathology report confirmed cholecystitis.  The patient reports that they are feeling well with normal bowel movements and good appetite.  The pre-operative symptoms of abdominal pain, nausea, and vomiting have resolved.  She was having some (R)sided pains up until last week, but they have subsided now.  Physical examination - Incisions appear well-healed with no sign of infection or bleeding.   Abdomen - soft, non-tender  Impression:  Cholecystitis s/p laparoscopic cholecystectomy  Plan:  She may resume a regular diet and full activity.  She may follow-up on a PRN basis.

## 2016-05-12 DIAGNOSIS — E7800 Pure hypercholesterolemia, unspecified: Secondary | ICD-10-CM | POA: Diagnosis not present

## 2016-05-12 DIAGNOSIS — M79603 Pain in arm, unspecified: Secondary | ICD-10-CM | POA: Diagnosis not present

## 2016-07-25 DIAGNOSIS — M545 Low back pain: Secondary | ICD-10-CM | POA: Diagnosis not present

## 2016-07-25 DIAGNOSIS — G8929 Other chronic pain: Secondary | ICD-10-CM | POA: Diagnosis not present

## 2016-07-25 DIAGNOSIS — M5136 Other intervertebral disc degeneration, lumbar region: Secondary | ICD-10-CM | POA: Diagnosis not present

## 2016-07-30 DIAGNOSIS — G8929 Other chronic pain: Secondary | ICD-10-CM | POA: Diagnosis not present

## 2016-07-30 DIAGNOSIS — M25562 Pain in left knee: Secondary | ICD-10-CM | POA: Diagnosis not present

## 2016-07-30 DIAGNOSIS — M25561 Pain in right knee: Secondary | ICD-10-CM | POA: Diagnosis not present

## 2016-09-23 DIAGNOSIS — M25562 Pain in left knee: Secondary | ICD-10-CM | POA: Diagnosis not present

## 2016-09-23 DIAGNOSIS — M25561 Pain in right knee: Secondary | ICD-10-CM | POA: Diagnosis not present

## 2016-09-23 DIAGNOSIS — G8929 Other chronic pain: Secondary | ICD-10-CM | POA: Diagnosis not present

## 2017-03-16 ENCOUNTER — Ambulatory Visit: Payer: 59 | Admitting: Psychiatry

## 2017-03-16 ENCOUNTER — Encounter: Payer: Self-pay | Admitting: Psychiatry

## 2017-03-16 ENCOUNTER — Other Ambulatory Visit: Payer: Self-pay

## 2017-03-16 VITALS — BP 144/80 | HR 82 | Temp 98.8°F | Ht 64.57 in | Wt 206.2 lb

## 2017-03-16 DIAGNOSIS — F410 Panic disorder [episodic paroxysmal anxiety] without agoraphobia: Secondary | ICD-10-CM | POA: Diagnosis not present

## 2017-03-16 DIAGNOSIS — F4323 Adjustment disorder with mixed anxiety and depressed mood: Secondary | ICD-10-CM | POA: Diagnosis not present

## 2017-03-16 MED ORDER — BUSPIRONE HCL 5 MG PO TABS: 5.0000 mg | ORAL_TABLET | Freq: Three times a day (TID) | ORAL | 1 refills | Status: DC

## 2017-03-16 MED ORDER — TRAZODONE HCL 50 MG PO TABS: 25.0000 mg | ORAL_TABLET | Freq: Every day | ORAL | 1 refills | Status: DC

## 2017-03-16 NOTE — Patient Instructions (Signed)
Buspirone tablets What is this medicine? BUSPIRONE (byoo SPYE rone) is used to treat anxiety disorders. This medicine may be used for other purposes; ask your health care provider or pharmacist if you have questions. COMMON BRAND NAME(S): BuSpar What should I tell my health care provider before I take this medicine? They need to know if you have any of these conditions: -kidney or liver disease -an unusual or allergic reaction to buspirone, other medicines, foods, dyes, or preservatives -pregnant or trying to get pregnant -breast-feeding How should I use this medicine? Take this medicine by mouth with a glass of water. Follow the directions on the prescription label. You may take this medicine with or without food. To ensure that this medicine always works the same way for you, you should take it either always with or always without food. Take your doses at regular intervals. Do not take your medicine more often than directed. Do not stop taking except on the advice of your doctor or health care professional. Talk to your pediatrician regarding the use of this medicine in children. Special care may be needed. Overdosage: If you think you have taken too much of this medicine contact a poison control center or emergency room at once. NOTE: This medicine is only for you. Do not share this medicine with others. What if I miss a dose? If you miss a dose, take it as soon as you can. If it is almost time for your next dose, take only that dose. Do not take double or extra doses. What may interact with this medicine? Do not take this medicine with any of the following medications: -linezolid -MAOIs like Carbex, Eldepryl, Marplan, Nardil, and Parnate -methylene blue -procarbazine This medicine may also interact with the following medications: -diazepam -digoxin -diltiazem -erythromycin -grapefruit juice -haloperidol -medicines for mental depression or mood problems -medicines for seizures like  carbamazepine, phenobarbital and phenytoin -nefazodone -other medications for anxiety -rifampin -ritonavir -some antifungal medicines like itraconazole, ketoconazole, and voriconazole -verapamil -warfarin This list may not describe all possible interactions. Give your health care provider a list of all the medicines, herbs, non-prescription drugs, or dietary supplements you use. Also tell them if you smoke, drink alcohol, or use illegal drugs. Some items may interact with your medicine. What should I watch for while using this medicine? Visit your doctor or health care professional for regular checks on your progress. It may take 1 to 2 weeks before your anxiety gets better. You may get drowsy or dizzy. Do not drive, use machinery, or do anything that needs mental alertness until you know how this drug affects you. Do not stand or sit up quickly, especially if you are an older patient. This reduces the risk of dizzy or fainting spells. Alcohol can make you more drowsy and dizzy. Avoid alcoholic drinks. What side effects may I notice from receiving this medicine? Side effects that you should report to your doctor or health care professional as soon as possible: -blurred vision or other vision changes -chest pain -confusion -difficulty breathing -feelings of hostility or anger -muscle aches and pains -numbness or tingling in hands or feet -ringing in the ears -skin rash and itching -vomiting -weakness Side effects that usually do not require medical attention (report to your doctor or health care professional if they continue or are bothersome): -disturbed dreams, nightmares -headache -nausea -restlessness or nervousness -sore throat and nasal congestion -stomach upset This list may not describe all possible side effects. Call your doctor for medical advice about side   effects. You may report side effects to FDA at 1-800-FDA-1088. Where should I keep my medicine? Keep out of the reach  of children. Store at room temperature below 30 degrees C (86 degrees F). Protect from light. Keep container tightly closed. Throw away any unused medicine after the expiration date. NOTE: This sheet is a summary. It may not cover all possible information. If you have questions about this medicine, talk to your doctor, pharmacist, or health care provider.  2018 Elsevier/Gold Standard (2009-08-30 18:06:11) Trazodone tablets What is this medicine? TRAZODONE (TRAZ oh done) is used to treat depression. This medicine may be used for other purposes; ask your health care provider or pharmacist if you have questions. COMMON BRAND NAME(S): Desyrel What should I tell my health care provider before I take this medicine? They need to know if you have any of these conditions: -attempted suicide or thinking about it -bipolar disorder -bleeding problems -glaucoma -heart disease, or previous heart attack -irregular heart beat -kidney or liver disease -low levels of sodium in the blood -an unusual or allergic reaction to trazodone, other medicines, foods, dyes or preservatives -pregnant or trying to get pregnant -breast-feeding How should I use this medicine? Take this medicine by mouth with a glass of water. Follow the directions on the prescription label. Take this medicine shortly after a meal or a light snack. Take your medicine at regular intervals. Do not take your medicine more often than directed. Do not stop taking this medicine suddenly except upon the advice of your doctor. Stopping this medicine too quickly may cause serious side effects or your condition may worsen. A special MedGuide will be given to you by the pharmacist with each prescription and refill. Be sure to read this information carefully each time. Talk to your pediatrician regarding the use of this medicine in children. Special care may be needed. Overdosage: If you think you have taken too much of this medicine contact a poison  control center or emergency room at once. NOTE: This medicine is only for you. Do not share this medicine with others. What if I miss a dose? If you miss a dose, take it as soon as you can. If it is almost time for your next dose, take only that dose. Do not take double or extra doses. What may interact with this medicine? Do not take this medicine with any of the following medications: -certain medicines for fungal infections like fluconazole, itraconazole, ketoconazole, posaconazole, voriconazole -cisapride -dofetilide -dronedarone -linezolid -MAOIs like Carbex, Eldepryl, Marplan, Nardil, and Parnate -mesoridazine -methylene blue (injected into a vein) -pimozide -saquinavir -thioridazine -ziprasidone This medicine may also interact with the following medications: -alcohol -antiviral medicines for HIV or AIDS -aspirin and aspirin-like medicines -barbiturates like phenobarbital -certain medicines for blood pressure, heart disease, irregular heart beat -certain medicines for depression, anxiety, or psychotic disturbances -certain medicines for migraine headache like almotriptan, eletriptan, frovatriptan, naratriptan, rizatriptan, sumatriptan, zolmitriptan -certain medicines for seizures like carbamazepine and phenytoin -certain medicines for sleep -certain medicines that treat or prevent blood clots like dalteparin, enoxaparin, warfarin -digoxin -fentanyl -lithium -NSAIDS, medicines for pain and inflammation, like ibuprofen or naproxen -other medicines that prolong the QT interval (cause an abnormal heart rhythm) -rasagiline -supplements like St. John's wort, kava kava, valerian -tramadol -tryptophan This list may not describe all possible interactions. Give your health care provider a list of all the medicines, herbs, non-prescription drugs, or dietary supplements you use. Also tell them if you smoke, drink alcohol, or use illegal drugs. Some items   may interact with your  medicine. What should I watch for while using this medicine? Tell your doctor if your symptoms do not get better or if they get worse. Visit your doctor or health care professional for regular checks on your progress. Because it may take several weeks to see the full effects of this medicine, it is important to continue your treatment as prescribed by your doctor. Patients and their families should watch out for new or worsening thoughts of suicide or depression. Also watch out for sudden changes in feelings such as feeling anxious, agitated, panicky, irritable, hostile, aggressive, impulsive, severely restless, overly excited and hyperactive, or not being able to sleep. If this happens, especially at the beginning of treatment or after a change in dose, call your health care professional. You may get drowsy or dizzy. Do not drive, use machinery, or do anything that needs mental alertness until you know how this medicine affects you. Do not stand or sit up quickly, especially if you are an older patient. This reduces the risk of dizzy or fainting spells. Alcohol may interfere with the effect of this medicine. Avoid alcoholic drinks. This medicine may cause dry eyes and blurred vision. If you wear contact lenses you may feel some discomfort. Lubricating drops may help. See your eye doctor if the problem does not go away or is severe. Your mouth may get dry. Chewing sugarless gum, sucking hard candy and drinking plenty of water may help. Contact your doctor if the problem does not go away or is severe. What side effects may I notice from receiving this medicine? Side effects that you should report to your doctor or health care professional as soon as possible: -allergic reactions like skin rash, itching or hives, swelling of the face, lips, or tongue -elevated mood, decreased need for sleep, racing thoughts, impulsive behavior -confusion -fast, irregular heartbeat -feeling faint or lightheaded,  falls -feeling agitated, angry, or irritable -loss of balance or coordination -painful or prolonged erections -restlessness, pacing, inability to keep still -suicidal thoughts or other mood changes -tremors -trouble sleeping -seizures -unusual bleeding or bruising Side effects that usually do not require medical attention (report to your doctor or health care professional if they continue or are bothersome): -change in sex drive or performance -change in appetite or weight -constipation -headache -muscle aches or pains -nausea This list may not describe all possible side effects. Call your doctor for medical advice about side effects. You may report side effects to FDA at 1-800-FDA-1088. Where should I keep my medicine? Keep out of the reach of children. Store at room temperature between 15 and 30 degrees C (59 to 86 degrees F). Protect from light. Keep container tightly closed. Throw away any unused medicine after the expiration date. NOTE: This sheet is a summary. It may not cover all possible information. If you have questions about this medicine, talk to your doctor, pharmacist, or health care provider.  2018 Elsevier/Gold Standard (2015-06-21 16:57:05)  
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
## 2017-03-17 ENCOUNTER — Encounter: Payer: Self-pay | Admitting: Psychiatry

## 2017-03-23 DIAGNOSIS — G4700 Insomnia, unspecified: Secondary | ICD-10-CM | POA: Diagnosis not present

## 2017-04-13 ENCOUNTER — Encounter: Payer: Self-pay | Admitting: Psychiatry

## 2017-04-13 ENCOUNTER — Ambulatory Visit: Payer: 59 | Admitting: Psychiatry

## 2017-04-13 ENCOUNTER — Telehealth (HOSPITAL_COMMUNITY): Payer: Self-pay | Admitting: Psychiatry

## 2017-04-13 ENCOUNTER — Other Ambulatory Visit: Payer: Self-pay

## 2017-04-13 VITALS — BP 125/80 | HR 98 | Temp 97.7°F | Wt 205.4 lb

## 2017-04-13 DIAGNOSIS — F410 Panic disorder [episodic paroxysmal anxiety] without agoraphobia: Secondary | ICD-10-CM | POA: Diagnosis not present

## 2017-04-13 DIAGNOSIS — F4323 Adjustment disorder with mixed anxiety and depressed mood: Secondary | ICD-10-CM

## 2017-04-13 MED ORDER — QUETIAPINE FUMARATE 25 MG PO TABS: 25.0000 mg | ORAL_TABLET | Freq: Every day | ORAL | 1 refills | Status: DC

## 2017-04-13 MED ORDER — BUSPIRONE HCL 5 MG PO TABS: 5.0000 mg | ORAL_TABLET | Freq: Three times a day (TID) | ORAL | 1 refills | Status: DC
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
## 2017-04-13 NOTE — Patient Instructions (Signed)
Quetiapine tablets What is this medicine? QUETIAPINE (kwe TYE a peen) is an antipsychotic. It is used to treat schizophrenia and bipolar disorder, also known as manic-depression. This medicine may be used for other purposes; ask your health care provider or pharmacist if you have questions. COMMON BRAND NAME(S): Seroquel What should I tell my health care provider before I take this medicine? They need to know if you have any of these conditions: -brain tumor or head injury -breast cancer -cataracts -diabetes -difficulty swallowing -heart disease -kidney disease -liver disease -low blood counts, like low white cell, platelet, or red cell counts -low blood pressure or dizziness when standing up -Parkinson's disease -previous heart attack -seizures -suicidal thoughts, plans, or attempt by you or a family member -thyroid disease -an unusual or allergic reaction to quetiapine, other medicines, foods, dyes, or preservatives -pregnant or trying to get pregnant -breast-feeding How should I use this medicine? Take this medicine by mouth. Swallow it with a drink of water. Follow the directions on the prescription label. If it upsets your stomach you can take it with food. Take your medicine at regular intervals. Do not take it more often than directed. Do not stop taking except on the advice of your doctor or health care professional. A special MedGuide will be given to you by the pharmacist with each prescription and refill. Be sure to read this information carefully each time. Talk to your pediatrician regarding the use of this medicine in children. While this drug may be prescribed for children as young as 10 years for selected conditions, precautions do apply. Patients over age 65 years may have a stronger reaction to this medicine and need smaller doses. Overdosage: If you think you have taken too much of this medicine contact a poison control center or emergency room at once. NOTE: This  medicine is only for you. Do not share this medicine with others. What if I miss a dose? If you miss a dose, take it as soon as you can. If it is almost time for your next dose, take only that dose. Do not take double or extra doses. What may interact with this medicine? Do not take this medicine with any of the following medications: -certain medicines for fungal infections like fluconazole, itraconazole, ketoconazole, posaconazole, voriconazole -cisapride -dofetilide -dronedarone -droperidol -grepafloxacin -halofantrine -phenothiazines like chlorpromazine, mesoridazine, thioridazine -pimozide -sparfloxacin -ziprasidone This medicine may also interact with the following medications: -alcohol -antiviral medicines for HIV or AIDS -certain medicines for blood pressure -certain medicines for depression, anxiety, or psychotic disturbances like haloperidol, lorazepam -certain medicines for diabetes -certain medicines for Parkinson's disease -certain medicines for seizures like carbamazepine, phenobarbital, phenytoin -cimetidine -erythromycin -other medicines that prolong the QT interval (cause an abnormal heart rhythm) -rifampin -steroid medicines like prednisone or cortisone This list may not describe all possible interactions. Give your health care provider a list of all the medicines, herbs, non-prescription drugs, or dietary supplements you use. Also tell them if you smoke, drink alcohol, or use illegal drugs. Some items may interact with your medicine. What should I watch for while using this medicine? Visit your doctor or health care professional for regular checks on your progress. It may be several weeks before you see the full effects of this medicine. Your health care provider may suggest that you have your eyes examined prior to starting this medicine, and every 6 months thereafter. If you have been taking this medicine regularly for some time, do not suddenly stop taking it.  You must gradually   reduce the dose or your symptoms may get worse. Ask your doctor or health care professional for advice. Patients and their families should watch out for worsening depression or thoughts of suicide. Also watch out for sudden or severe changes in feelings such as feeling anxious, agitated, panicky, irritable, hostile, aggressive, impulsive, severely restless, overly excited and hyperactive, or not being able to sleep. If this happens, especially at the beginning of antidepressant treatment or after a change in dose, call your health care professional. You may get dizzy or drowsy. Do not drive, use machinery, or do anything that needs mental alertness until you know how this medicine affects you. Do not stand or sit up quickly, especially if you are an older patient. This reduces the risk of dizzy or fainting spells. Alcohol can increase dizziness and drowsiness. Avoid alcoholic drinks. Do not treat yourself for colds, diarrhea or allergies. Ask your doctor or health care professional for advice, some ingredients may increase possible side effects. This medicine can reduce the response of your body to heat or cold. Dress warm in cold weather and stay hydrated in hot weather. If possible, avoid extreme temperatures like saunas, hot tubs, very hot or cold showers, or activities that can cause dehydration such as vigorous exercise. What side effects may I notice from receiving this medicine? Side effects that you should report to your doctor or health care professional as soon as possible: -allergic reactions like skin rash, itching or hives, swelling of the face, lips, or tongue -difficulty swallowing -fast or irregular heartbeat -fever or chills, sore throat -fever with rash, swollen lymph nodes, or swelling of the face -increased hunger or thirst -increased urination -problems with balance, talking, walking -seizures -stiff muscles -suicidal thoughts or other mood  changes -uncontrollable head, mouth, neck, arm, or leg movements -unusually weak or tired Side effects that usually do not require medical attention (report to your doctor or health care professional if they continue or are bothersome): -change in sex drive or performance -constipation -drowsy or dizzy -dry mouth -stomach upset -weight gain This list may not describe all possible side effects. Call your doctor for medical advice about side effects. You may report side effects to FDA at 1-800-FDA-1088. Where should I keep my medicine? Keep out of the reach of children. Store at room temperature between 15 and 30 degrees C (59 and 86 degrees F). Throw away any unused medicine after the expiration date. NOTE: This sheet is a summary. It may not cover all possible information. If you have questions about this medicine, talk to your doctor, pharmacist, or health care provider.  2018 Elsevier/Gold Standard (2014-07-25 13:07:35)  

## 2017-04-20 ENCOUNTER — Encounter (HOSPITAL_COMMUNITY): Payer: Self-pay | Admitting: Psychiatry

## 2017-04-20 ENCOUNTER — Other Ambulatory Visit (HOSPITAL_COMMUNITY): Payer: 59 | Attending: Psychiatry | Admitting: Psychiatry

## 2017-04-20 DIAGNOSIS — F410 Panic disorder [episodic paroxysmal anxiety] without agoraphobia: Secondary | ICD-10-CM

## 2017-04-20 DIAGNOSIS — F4310 Post-traumatic stress disorder, unspecified: Secondary | ICD-10-CM | POA: Diagnosis not present

## 2017-04-20 DIAGNOSIS — Z79899 Other long term (current) drug therapy: Secondary | ICD-10-CM | POA: Insufficient documentation

## 2017-04-20 DIAGNOSIS — F4323 Adjustment disorder with mixed anxiety and depressed mood: Secondary | ICD-10-CM | POA: Diagnosis present

## 2017-04-20 DIAGNOSIS — F17210 Nicotine dependence, cigarettes, uncomplicated: Secondary | ICD-10-CM | POA: Insufficient documentation
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
## 2017-04-21 ENCOUNTER — Other Ambulatory Visit (HOSPITAL_COMMUNITY): Payer: 59
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
## 2017-04-22 ENCOUNTER — Encounter (HOSPITAL_COMMUNITY): Payer: Self-pay

## 2017-04-22 ENCOUNTER — Other Ambulatory Visit (HOSPITAL_COMMUNITY): Payer: 59 | Admitting: Psychiatry

## 2017-04-22 DIAGNOSIS — F4323 Adjustment disorder with mixed anxiety and depressed mood: Secondary | ICD-10-CM
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
## 2017-04-23 ENCOUNTER — Ambulatory Visit (HOSPITAL_COMMUNITY): Payer: Self-pay | Admitting: Psychiatry

## 2017-04-23 ENCOUNTER — Other Ambulatory Visit (HOSPITAL_COMMUNITY): Payer: 59
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
## 2017-04-24 ENCOUNTER — Other Ambulatory Visit (HOSPITAL_COMMUNITY): Payer: 59 | Admitting: Psychiatry

## 2017-04-24 DIAGNOSIS — F4323 Adjustment disorder with mixed anxiety and depressed mood: Secondary | ICD-10-CM

## 2017-04-27 ENCOUNTER — Other Ambulatory Visit (HOSPITAL_COMMUNITY): Payer: 59 | Admitting: Psychiatry

## 2017-04-27 ENCOUNTER — Ambulatory Visit: Payer: 59 | Admitting: Psychiatry

## 2017-04-27 DIAGNOSIS — F4323 Adjustment disorder with mixed anxiety and depressed mood: Secondary | ICD-10-CM
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
## 2017-04-28 ENCOUNTER — Other Ambulatory Visit (HOSPITAL_COMMUNITY): Payer: 59 | Admitting: Psychiatry

## 2017-04-28 DIAGNOSIS — F4323 Adjustment disorder with mixed anxiety and depressed mood: Secondary | ICD-10-CM | POA: Diagnosis not present

## 2017-04-29 ENCOUNTER — Other Ambulatory Visit (HOSPITAL_COMMUNITY): Payer: 59 | Admitting: Psychiatry

## 2017-04-29 DIAGNOSIS — F4323 Adjustment disorder with mixed anxiety and depressed mood: Secondary | ICD-10-CM | POA: Diagnosis not present
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
## 2017-04-30 ENCOUNTER — Other Ambulatory Visit (HOSPITAL_COMMUNITY): Payer: 59 | Admitting: Psychiatry

## 2017-04-30 DIAGNOSIS — F4323 Adjustment disorder with mixed anxiety and depressed mood: Secondary | ICD-10-CM | POA: Diagnosis not present

## 2017-05-01 ENCOUNTER — Other Ambulatory Visit (HOSPITAL_COMMUNITY): Payer: 59

## 2017-05-01 NOTE — Progress Notes (Signed)
    Daily Group Progress Note  Program: IOP  Group Time: 9:00-12:00  Participation Level: Active  Behavioral Response: Appropriate  Type of Therapy:  Group Therapy  Summary of Progress:  Pt. Presented as talkative, engaged in the group process. Pt. Participated in grief and loss group with the Chaplain. Pt. Participated in presentation about community resources presented by the mental health association. Pt. Connected with other group members around fears of returning to work, general dissatisfaction with work, and surrendering control over life tragedies that make us feel vulnerable.      Shaune PollackBrown, Jennifer B, LPC

## 2017-05-01 NOTE — Progress Notes (Signed)
    Daily Group Progress Note  Program: IOP  Group Time: 9:00-12:00  Participation Level: Active  Behavioral Response: Appropriate  Type of Therapy:  Group Therapy   Summary of Progress: The first part of group today was facilitated by the pharmacist focusing on the various medications that are prescribes to patients with depression, anxiety, and bipolar.  The pharmacist addressed the patients concerns about their medications, side effects and concerns that they had.  The second part of group was facilitated by the therapist addressing the different issues that each member brought to groups.  The patient shared she was fine.  She expressed to group that her husband never wants to admit when he's wrong either as she related to other group members with similar experience.  The patient listened to what other group members were sharing and gave some insights from time to time. The patient was active and her behavioral response was appropriate.     Shaune PollackBrown, Jennifer B, LPC

## 2017-05-04 ENCOUNTER — Other Ambulatory Visit (HOSPITAL_COMMUNITY): Payer: 59 | Attending: Psychiatry | Admitting: Psychiatry

## 2017-05-04 DIAGNOSIS — F4323 Adjustment disorder with mixed anxiety and depressed mood: Secondary | ICD-10-CM | POA: Insufficient documentation

## 2017-05-04 NOTE — Progress Notes (Signed)
    Daily Group Progress Note  Program: IOP  Group Time: 9:00-12:00 Participation Level: Minimal Behavioral Response: Sharing, Appropriate Type of Therapy:  Group Summary of Progress: The group session focused on themes involving topics for rationalizing and processing external events outside of the group members' control. The second half of the group involved a psychoeducation wellness presentation along with answering questions pertaining to health and wellness. Patient presented minimal feedback throughout the group; however, she remained attentive and present towards other members. Pt. stated that she is at a place in her life where she has been working on letting go of control, especially from the reactions that she may receive from returning to work.  Rebecca Wyatt, Rebecca Wyatt, LPC

## 2017-05-05 ENCOUNTER — Other Ambulatory Visit (HOSPITAL_COMMUNITY): Payer: 59 | Admitting: Psychiatry

## 2017-05-05 ENCOUNTER — Telehealth (HOSPITAL_COMMUNITY): Payer: Self-pay | Admitting: Psychiatry

## 2017-05-05 NOTE — Progress Notes (Signed)
    Daily Group Progress Note  Program: IOP  Group Time: 9:00-12:00 Participation Level: Minimal Behavioral Response: Sharing, Appropriate Type of Therapy:  Group Summary of Progress: The group session focused on themes involving topics pertaining to maintaining a state of mindfulness during uncomfortable events. Group members also discussed inner selves that are being hidden which may be affecting how patients move forward in their lives along with topics in regards to acceptance to external events that are out of one's control. Patient expressed concern and apprehension about returning to her job environment. Pt. also expressed emotion about not being satisfied with how coworkers use excessive profanity and may act vulgar in general during the job, which she takes very personally. In regards to her personal counselor, patient states being told that "it's all in her head." Group members offered support in terms of differentiating herself from the way that her coworkers act, although patient was a bit reluctant to accept group feedback. Shaune PollackBrown, Jennifer B, LPC

## 2017-05-06 ENCOUNTER — Ambulatory Visit: Payer: 59 | Admitting: Psychiatry

## 2017-05-06 ENCOUNTER — Other Ambulatory Visit (HOSPITAL_COMMUNITY): Payer: 59 | Admitting: Psychiatry

## 2017-05-07 ENCOUNTER — Other Ambulatory Visit (HOSPITAL_COMMUNITY): Payer: 59 | Admitting: Psychiatry

## 2017-05-07 DIAGNOSIS — F4323 Adjustment disorder with mixed anxiety and depressed mood: Secondary | ICD-10-CM | POA: Diagnosis not present

## 2017-05-07 NOTE — Progress Notes (Signed)
    Daily Group Progress Note  Program: IOP  Group Time: 9:00-12:00  Participation Level: Active  Behavioral Response: Appropriate  Type of Therapy:  Group Therapy  Summary of Progress: Pt. Presents as actively engaged in group, significantly brightened affect, talkative. Pt. Shared relevant feedback to other patient regarding forgiving and accepting a situation outside of her control. Pt. Discussed patterns of avoidance in her personal life and discussed ways that she can begin to face life tasks and situations that she currently has low tolerance for. Pt. Was encouraged to take small steps and build in rewards for her efforts. Pt. Participated in yoga therapy with Forde RadonLeanne Yates, LPC, RYT.    Shaune PollackBrown, Ja Pistole B, LPC

## 2017-05-08 ENCOUNTER — Other Ambulatory Visit (HOSPITAL_COMMUNITY): Payer: 59 | Admitting: Psychiatry

## 2017-05-08 DIAGNOSIS — F4323 Adjustment disorder with mixed anxiety and depressed mood: Secondary | ICD-10-CM

## 2017-05-09 ENCOUNTER — Other Ambulatory Visit: Payer: Self-pay | Admitting: Psychiatry

## 2017-05-09 DIAGNOSIS — F4323 Adjustment disorder with mixed anxiety and depressed mood: Secondary | ICD-10-CM

## 2017-05-11 ENCOUNTER — Other Ambulatory Visit (HOSPITAL_COMMUNITY): Payer: 59 | Admitting: Psychiatry

## 2017-05-11 DIAGNOSIS — F4323 Adjustment disorder with mixed anxiety and depressed mood: Secondary | ICD-10-CM | POA: Diagnosis not present

## 2017-05-12 ENCOUNTER — Other Ambulatory Visit (HOSPITAL_COMMUNITY): Payer: 59

## 2017-05-12 NOTE — Progress Notes (Signed)
    Daily Group Progress Note  Program: IOP  Group Time: 9:00-12:00  Participation Level: Active  Behavioral Response: Appropriate  Type of Therapy:  Group Therapy  Summary of Progress: Pt. Presents with brightened affect, engaged in the group process. Pt. Has made significant improvement in her ability to speak freely in the group, provide feedback to others, and receive feedback from the group regarding her anxiety and depression. Pt. Continues to express concern about her return to work and is working on developing healthy boundaries in relationships with her co-workers so that she is less triggered by interactions with them.    Rebecca Wyatt, Rebecca Wyatt, LPC

## 2017-05-13 ENCOUNTER — Other Ambulatory Visit (HOSPITAL_COMMUNITY): Payer: 59

## 2017-05-14 ENCOUNTER — Other Ambulatory Visit (HOSPITAL_COMMUNITY): Payer: 59 | Admitting: Psychiatry

## 2017-05-14 DIAGNOSIS — F410 Panic disorder [episodic paroxysmal anxiety] without agoraphobia: Secondary | ICD-10-CM

## 2017-05-14 DIAGNOSIS — F4323 Adjustment disorder with mixed anxiety and depressed mood: Secondary | ICD-10-CM | POA: Diagnosis not present

## 2017-05-14 NOTE — Progress Notes (Signed)
    Daily Group Progress Note  Program: IOP  Group Time: 9:00-12:00 Participation Level: Minimal Behavioral Response: Appropriate Type of Therapy:  Group Summary of Progress: The first half of the group involved the pharmacist and speaking to patients. Pharmacist spoke about medication modifications, medication management and answered any comments or concerns from patients. The second half of group focused on topics pertaining to transforming guilt into gratitude. Group members also spoke about asserting personal needs towards others. Patient reported that she was doing much better. Pt. was attentive towards other members of the group along with being more observant. Also provided few feedback remarks towards other members of group. Responded very positively towards member who gave her a hand made bracelet.  Shaune PollackBrown, Shyler Hamill B, LPC

## 2017-05-14 NOTE — Progress Notes (Signed)
    Daily Group Progress Note  Program: IOP  Group Time: 9:00-12:00  Participation Level: Active  Behavioral Response: Appropriate  Type of Therapy:  Group Therapy  Summary of Progress: Pt. Presents as talkative, engaged in the group process. Pt. Has developed awareness about her personality and preferences that have contributed to her anxiety and stress at work. Pt. Continues to work on accepting situations outside of her control and tolerance so that she can return to work with less stress.     Shaune PollackBrown, Caitlyn Buchanan B, LPC

## 2017-05-15 ENCOUNTER — Other Ambulatory Visit (HOSPITAL_COMMUNITY): Payer: 59 | Admitting: Psychiatry

## 2017-05-18 ENCOUNTER — Other Ambulatory Visit (HOSPITAL_COMMUNITY): Payer: 59 | Admitting: Psychiatry

## 2017-05-18 DIAGNOSIS — F4323 Adjustment disorder with mixed anxiety and depressed mood: Secondary | ICD-10-CM

## 2017-05-19 ENCOUNTER — Other Ambulatory Visit (HOSPITAL_COMMUNITY): Payer: 59 | Admitting: Psychiatry

## 2017-05-19 DIAGNOSIS — F4323 Adjustment disorder with mixed anxiety and depressed mood: Secondary | ICD-10-CM | POA: Diagnosis not present
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
## 2017-05-19 NOTE — Patient Instructions (Signed)
D:  Patient completed MH-IOP today.  A:  Discharge today.  Follow up with Dr. Elna BreslowEappen 05-25-17 @ 3 pm and Dr. Ellery PlunkHedding 05-20-17 @ 9 a.m.  Encouraged support groups.  R:  Patient receptive.
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
## 2017-05-20 ENCOUNTER — Other Ambulatory Visit (HOSPITAL_COMMUNITY): Payer: 59

## 2017-05-21 ENCOUNTER — Other Ambulatory Visit (HOSPITAL_COMMUNITY): Payer: 59

## 2017-05-22 ENCOUNTER — Other Ambulatory Visit (HOSPITAL_COMMUNITY): Payer: 59

## 2017-05-25 ENCOUNTER — Ambulatory Visit: Payer: 59 | Admitting: Psychiatry

## 2017-05-25 ENCOUNTER — Other Ambulatory Visit: Payer: Self-pay

## 2017-05-25 ENCOUNTER — Encounter: Payer: Self-pay | Admitting: Psychiatry

## 2017-05-25 ENCOUNTER — Other Ambulatory Visit (HOSPITAL_COMMUNITY): Payer: 59

## 2017-05-25 VITALS — BP 142/85 | HR 85 | Temp 98.2°F | Wt 204.4 lb

## 2017-05-25 DIAGNOSIS — F5105 Insomnia due to other mental disorder: Secondary | ICD-10-CM

## 2017-05-25 DIAGNOSIS — F321 Major depressive disorder, single episode, moderate: Secondary | ICD-10-CM | POA: Diagnosis not present

## 2017-05-25 DIAGNOSIS — F410 Panic disorder [episodic paroxysmal anxiety] without agoraphobia: Secondary | ICD-10-CM | POA: Diagnosis not present
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
## 2017-05-26 ENCOUNTER — Other Ambulatory Visit (HOSPITAL_COMMUNITY): Payer: 59

## 2017-05-27 ENCOUNTER — Other Ambulatory Visit (HOSPITAL_COMMUNITY): Payer: 59
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
## 2017-05-27 NOTE — Progress Notes (Signed)
    Daily Group Progress Note  Program: IOP  Group Time: 9:00-12:00  Participation Level: Active  Behavioral Response: Appropriate  Type of Therapy:  Group Therapy  Summary of Progress: The theme in group today was, "Communicating what you want / need, trust, and accountability." The therapist facilitated the session addressing each patient's issues as she offered coping strategies to help group members improve on asserting themselves without feeling guilty or uncomfortable and trusting themselves. The patient stated she was feeling okay.  She reflected on the importance of communication as she related to group members. The patient was active in group and her behavioral response was appropriate.        Shaune PollackBrown, Jennifer B, LPC

## 2017-05-28 ENCOUNTER — Other Ambulatory Visit (HOSPITAL_COMMUNITY): Payer: 59

## 2017-05-29 ENCOUNTER — Other Ambulatory Visit (HOSPITAL_COMMUNITY): Payer: 59

## 2017-06-01 ENCOUNTER — Ambulatory Visit: Payer: 59 | Admitting: Psychiatry

## 2017-06-01 ENCOUNTER — Encounter: Payer: Self-pay | Admitting: Psychiatry

## 2017-06-01 ENCOUNTER — Other Ambulatory Visit: Payer: Self-pay

## 2017-06-01 ENCOUNTER — Other Ambulatory Visit (HOSPITAL_COMMUNITY): Payer: 59

## 2017-06-01 VITALS — BP 137/83 | HR 82 | Temp 98.6°F | Ht 66.0 in | Wt 206.2 lb

## 2017-06-01 DIAGNOSIS — F321 Major depressive disorder, single episode, moderate: Secondary | ICD-10-CM

## 2017-06-01 DIAGNOSIS — F5105 Insomnia due to other mental disorder: Secondary | ICD-10-CM | POA: Diagnosis not present

## 2017-06-01 DIAGNOSIS — F410 Panic disorder [episodic paroxysmal anxiety] without agoraphobia: Secondary | ICD-10-CM

## 2017-06-01 NOTE — Patient Instructions (Addendum)
Continue taking seroquel 50 mg for next 7 days . Try working on getting your wake up time earlier than what it is now. If you are OK , and get used to seroquel 50 mg, then continue it. If not , then start taking seroquel 37.5 mg ( 1 1/2 tablet) after a week and see if that is a good dosage for you.
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
## 2017-06-02 ENCOUNTER — Other Ambulatory Visit (HOSPITAL_COMMUNITY): Payer: 59

## 2017-06-03 ENCOUNTER — Other Ambulatory Visit (HOSPITAL_COMMUNITY): Payer: 59

## 2017-06-04 ENCOUNTER — Other Ambulatory Visit (HOSPITAL_COMMUNITY): Payer: 59

## 2017-06-04 ENCOUNTER — Telehealth (HOSPITAL_COMMUNITY): Payer: Self-pay

## 2017-06-04 NOTE — Telephone Encounter (Signed)
Dr.Ellery Plunkding left a voicemail at my office wanting to discuss this patient with you. Please return his call at [phone], thank you

## 2017-06-05 ENCOUNTER — Other Ambulatory Visit: Payer: Self-pay | Admitting: Psychiatry

## 2017-06-05 ENCOUNTER — Other Ambulatory Visit (HOSPITAL_COMMUNITY): Payer: 59

## 2017-06-05 DIAGNOSIS — F410 Panic disorder [episodic paroxysmal anxiety] without agoraphobia: Secondary | ICD-10-CM

## 2017-06-05 DIAGNOSIS — F4323 Adjustment disorder with mixed anxiety and depressed mood: Secondary | ICD-10-CM

## 2017-06-08 ENCOUNTER — Other Ambulatory Visit (HOSPITAL_COMMUNITY): Payer: 59

## 2017-06-08 NOTE — Telephone Encounter (Signed)
Called [name], left voicemail to call back

## 2017-06-09 ENCOUNTER — Other Ambulatory Visit (HOSPITAL_COMMUNITY): Payer: 59

## 2017-06-10 ENCOUNTER — Other Ambulatory Visit (HOSPITAL_COMMUNITY): Payer: 59

## 2017-06-11 ENCOUNTER — Other Ambulatory Visit (HOSPITAL_COMMUNITY): Payer: 59

## 2017-06-12 ENCOUNTER — Other Ambulatory Visit (HOSPITAL_COMMUNITY): Payer: 59

## 2017-06-15 ENCOUNTER — Ambulatory Visit: Payer: 59 | Admitting: Psychiatry

## 2017-06-15 ENCOUNTER — Encounter: Payer: Self-pay | Admitting: Psychiatry

## 2017-06-15 ENCOUNTER — Other Ambulatory Visit: Payer: Self-pay

## 2017-06-15 VITALS — BP 151/91 | HR 81 | Temp 98.6°F | Wt 206.8 lb

## 2017-06-15 DIAGNOSIS — F321 Major depressive disorder, single episode, moderate: Secondary | ICD-10-CM

## 2017-06-15 DIAGNOSIS — F410 Panic disorder [episodic paroxysmal anxiety] without agoraphobia: Secondary | ICD-10-CM | POA: Diagnosis not present

## 2017-06-15 DIAGNOSIS — F5105 Insomnia due to other mental disorder: Secondary | ICD-10-CM | POA: Diagnosis not present

## 2017-06-15 MED ORDER — SERTRALINE HCL 100 MG PO TABS: 200.0000 mg | ORAL_TABLET | Freq: Every day | ORAL | 1 refills | Status: DC

## 2017-06-15 MED ORDER — QUETIAPINE FUMARATE ER 50 MG PO TB24: 50.0000 mg | ORAL_TABLET | Freq: Every day | ORAL | 1 refills | Status: DC

## 2017-06-15 NOTE — Patient Instructions (Signed)
Quetiapine Extended Release Tablets What is this medicine? QUETIAPINE (kwe TYE a peen) is an antipsychotic. It is used to treat schizophrenia and bipolar disorder, also known as manic-depression. It is also used to treat major depression in combination with antidepressants. This medicine may be used for other purposes; ask your health care provider or pharmacist if you have questions. COMMON BRAND NAME(S): Seroquel XR What should I tell my health care provider before I take this medicine? They need to know if you have any of these conditions: -brain tumor or head injury -breast cancer -cataracts -diabetes -difficulty swallowing -heart disease -kidney disease -liver disease -low blood counts, like low white cell, platelet, or red cell counts -low blood pressure or dizziness when standing up -Parkinson's disease -previous heart attack -seizures -suicidal thoughts, plans, or attempt by you or a family member -thyroid disease -an unusual or allergic reaction to quetiapine, other medicines, foods, dyes, or preservatives -pregnant or trying to get pregnant -breast-feeding How should I use this medicine? Take this medicine by mouth with a glass of water. Follow the directions on the prescription label. Do not cut, crush or chew this medicine. Take this medicine on an empty stomach or with a light meal. Do not take this medicine with a full heavy meal. Take your medicine at regular intervals. Do not take it more often than directed. Do not stop taking except on your doctor's advice. A special MedGuide will be given to you by the pharmacist with each prescription and refill. Be sure to read this information carefully each time. Talk to your pediatrician regarding the use of this medicine in children. While this drug may be prescribed for children as young as 10 years for selected conditions, precautions do apply. Patients over age 65 years may have a stronger reaction to this medicine and need  smaller doses. Overdosage: If you think you have taken too much of this medicine contact a poison control center or emergency room at once. NOTE: This medicine is only for you. Do not share this medicine with others. What if I miss a dose? If you miss a dose, take it as soon as you can. If it is almost time for your next dose, take only that dose. Do not take double or extra doses. What may interact with this medicine? Do not take this medicine with any of the following medications: -certain medicines for fungal infections like fluconazole, itraconazole, ketoconazole, posaconazole, voriconazole -cisapride -dofetilide -dronedarone -droperidol -grepafloxacin -halofantrine -phenothiazines like chlorpromazine, mesoridazine, thioridazine -pimozide -sparfloxacin -ziprasidone This medicine may also interact with the following medications: -alcohol -antiviral medicines for HIV or AIDS -certain medicines for blood pressure -certain medicines for depression, anxiety, or psychotic disturbances like haloperidol, lorazepam -certain medicines for diabetes -certain medicines for Parkinson's disease -certain medicines for seizures like carbamazepine, phenobarbital, phenytoin -cimetidine -erythromycin -other medicines that prolong the QT interval (cause an abnormal heart rhythm) -rifampin -steroid medicines like prednisone or cortisone This list may not describe all possible interactions. Give your health care provider a list of all the medicines, herbs, non-prescription drugs, or dietary supplements you use. Also tell them if you smoke, drink alcohol, or use illegal drugs. Some items may interact with your medicine. What should I watch for while using this medicine? Visit your doctor or health care professional for regular checks on your progress. It may be several weeks before you see the full effects of this medicine. Your health care provider may suggest that you have your eyes examined prior to  starting this   medicine, and every 6 months thereafter. If you have been taking this medicine regularly for some time, do not suddenly stop taking it. You must gradually reduce the dose or your symptoms may get worse. Ask your doctor or health care professional for advice. Patients and their families should watch out for depression or thoughts of suicide that get worse. Also watch out for sudden or severe changes in feelings such as feeling anxious, agitated, panicky, irritable, hostile, aggressive, impulsive, severely restless, overly excited and hyperactive, or not being able to sleep. If this happens, especially at the beginning of antidepressant treatment or after a change in dose, call your health care professional. You may get drowsy or dizzy. Do not drive, use machinery, or do anything that needs mental alertness until you know how this medicine affects you. Do not stand or sit up quickly, especially if you are an older patient. This reduces the risk of dizzy or fainting spells. Alcohol may interfere with the effect of this medicine. Avoid alcoholic drinks. Do not treat yourself for colds, diarrhea or allergies. Ask your doctor or health care professional for advice, some ingredients may increase possible side effects. This medicine can reduce the response of your body to heat or cold. Dress warm in cold weather and stay hydrated in hot weather. If possible, avoid extreme temperatures like saunas, hot tubs, very hot or cold showers, or activities that can cause dehydration such as vigorous exercise. What side effects may I notice from receiving this medicine? Side effects that you should report to your doctor or health care professional as soon as possible: -allergic reactions like skin rash, itching or hives, swelling of the face, lips, or tongue -difficulty swallowing -fast or irregular heartbeat -fever or chills, sore throat -fever with rash, swollen lymph nodes, or swelling of the  face -increased hunger or thirst -increased urination -problems with balance, talking, walking -seizures -stiff muscles -suicidal thoughts or other mood changes -uncontrollable head, mouth, neck, arm, or leg movements -unusually weak or tired Side effects that usually do not require medical attention (report to your doctor or health care professional if they continue or are bothersome): -change in sex drive or performance -constipation -drowsy or dizzy -dry mouth -stomach upset -weight gain This list may not describe all possible side effects. Call your doctor for medical advice about side effects. You may report side effects to FDA at 1-800-FDA-1088. Where should I keep my medicine? Keep out of the reach of children. Store at room temperature between 15 and 30 degrees C (59 and 86 degrees F). Throw away any unused medicine after the expiration date. NOTE: This sheet is a summary. It may not cover all possible information. If you have questions about this medicine, talk to your doctor, pharmacist, or health care provider.  2018 Elsevier/Gold Standard (2014-07-25 13:10:33)  
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
## 2017-06-25 ENCOUNTER — Other Ambulatory Visit: Payer: Self-pay

## 2017-06-25 ENCOUNTER — Encounter: Payer: Self-pay | Admitting: Psychiatry

## 2017-06-25 ENCOUNTER — Ambulatory Visit: Payer: 59 | Admitting: Psychiatry

## 2017-06-25 VITALS — BP 144/80 | HR 86 | Temp 98.4°F | Wt 204.6 lb

## 2017-06-25 DIAGNOSIS — F410 Panic disorder [episodic paroxysmal anxiety] without agoraphobia: Secondary | ICD-10-CM

## 2017-06-25 DIAGNOSIS — F321 Major depressive disorder, single episode, moderate: Secondary | ICD-10-CM

## 2017-06-25 MED ORDER — ARIPIPRAZOLE 2 MG PO TABS: 2.0000 mg | ORAL_TABLET | Freq: Every day | ORAL | 0 refills | Status: DC

## 2017-06-25 NOTE — Patient Instructions (Signed)
Aripiprazole tablets What is this medicine? ARIPIPRAZOLE (ay ri PIP ray zole) is an atypical antipsychotic. It is used to treat schizophrenia and bipolar disorder, also known as manic-depression. It is also used to treat Tourette's disorder and some symptoms of autism. This medicine may also be used in combination with antidepressants to treat major depressive disorder. This medicine may be used for other purposes; ask your health care provider or pharmacist if you have questions. COMMON BRAND NAME(S): Abilify What should I tell my health care provider before I take this medicine? They need to know if you have any of these conditions: -dehydration -dementia -diabetes -heart disease -history of stroke -low blood counts, like low white cell, platelet, or red cell counts -Parkinson's disease -seizures -suicidal thoughts, plans, or attempt; a previous suicide attempt by you or a family member -an unusual or allergic reaction to aripiprazole, other medicines, foods, dyes, or preservatives -pregnant or trying to get pregnant -breast-feeding How should I use this medicine? Take this medicine by mouth with a glass of water. Follow the directions on the prescription label. You can take this medicine with or without food. Take your doses at regular intervals. Do not take your medicine more often than directed. Do not stop taking except on the advice of your doctor or health care professional. A special MedGuide will be given to you by the pharmacist with each prescription and refill. Be sure to read this information carefully each time. Talk to your pediatrician regarding the use of this medicine in children. While this drug may be prescribed for children as young as 6 years of age for selected conditions, precautions do apply. Overdosage: If you think you have taken too much of this medicine contact a poison control center or emergency room at once. NOTE: This medicine is only for you. Do not share  this medicine with others. What if I miss a dose? If you miss a dose, take it as soon as you can. If it is almost time for your next dose, take only that dose. Do not take double or extra doses. What may interact with this medicine? Do not take this medicine with any of the following medications: -brexpiprazole -cisapride -dofetilide -dronedarone -metoclopramide -pimozide -thioridazine This medicine may also interact with the following medications: -alcohol -carbamazepine -certain medicines for anxiety or sleep -certain medicines for blood pressure -certain medicines for fungal infections like ketoconazole, fluconazole, posaconazole, and itraconazole -clarithromycin -fluoxetine -other medicines that prolong the QT interval (cause an abnormal heart rhythm) -paroxetine -quinidine -rifampin This list may not describe all possible interactions. Give your health care provider a list of all the medicines, herbs, non-prescription drugs, or dietary supplements you use. Also tell them if you smoke, drink alcohol, or use illegal drugs. Some items may interact with your medicine. What should I watch for while using this medicine? Visit your doctor or health care professional for regular checks on your progress. It may be several weeks before you see the full effects of this medicine. Do not suddenly stop taking this medicine. You may need to gradually reduce the dose. Patients and their families should watch out for worsening depression or thoughts of suicide. Also watch out for sudden changes in feelings such as feeling anxious, agitated, panicky, irritable, hostile, aggressive, impulsive, severely restless, overly excited and hyperactive, or not being able to sleep. If this happens, especially at the beginning of antidepressant treatment or after a change in dose, call your health care professional. You may get dizzy or drowsy. Do   not drive, use machinery, or do anything that needs mental  alertness until you know how this medicine affects you. Do not stand or sit up quickly, especially if you are an older patient. This reduces the risk of dizzy or fainting spells. Alcohol can increase dizziness and drowsiness. Avoid alcoholic drinks. This medicine can reduce the response of your body to heat or cold. Dress warm in cold weather and stay hydrated in hot weather. If possible, avoid extreme temperatures like saunas, hot tubs, very hot or cold showers, or activities that can cause dehydration such as vigorous exercise. This medicine may cause dry eyes and blurred vision. If you wear contact lenses you may feel some discomfort. Lubricating drops may help. See your eye doctor if the problem does not go away or is severe. If you notice an increased hunger or thirst, different from your normal hunger or thirst, or if you find that you have to urinate more frequently, you should contact your health care provider as soon as possible. You may need to have your blood sugar monitored. This medicine may cause changes in your blood sugar levels. You should monitor you blood sugar frequently if you have diabetes. There have been reports of uncontrollable and strong urges to gamble, binge eat, shop, and have sex while taking this medicine. If you experience any of these or other uncontrollable and strong urges while taking this medicine, you should report it to your health care provider as soon as possible. What side effects may I notice from receiving this medicine? Side effects that you should report to your doctor or health care professional as soon as possible: -allergic reactions like skin rash, itching or hives, swelling of the face, lips, or tongue -breathing problems -confusion -feeling faint or lightheaded, falls -fever or chills, sore throat -increased hunger or thirst -increased urination -joint pain -muscles pain, spasms -problems with balance, talking, walking -restlessness or need to  keep moving -seizures -suicidal thoughts or other mood changes -trouble swallowing -uncontrollable and excessive urges (examples: gambling, binge eating, shopping, having sex) -uncontrollable head, mouth, neck, arm, or leg movements -unusually weak or tired Side effects that usually do not require medical attention (report to your doctor or health care professional if they continue or are bothersome): -blurred vision -constipation -headache -nausea, vomiting -trouble sleeping -weight gain This list may not describe all possible side effects. Call your doctor for medical advice about side effects. You may report side effects to FDA at 1-800-FDA-1088. Where should I keep my medicine? Keep out of the reach of children. Store at room temperature between 15 and 30 degrees C (59 and 86 degrees F). Throw away any unused medicine after the expiration date. NOTE: This sheet is a summary. It may not cover all possible information. If you have questions about this medicine, talk to your doctor, pharmacist, or health care provider.  2018 Elsevier/Gold Standard (2016-01-06 11:45:05)  

## 2017-06-25 NOTE — Progress Notes (Signed)
BH MD OP Progress Note  06/25/2017 5:32 PM Rebecca Wyatt  MRN:  [id number]  Chief Complaint: ' I am here for follow up." Chief Complaint    Follow-up; Medication Refill     HPI: Rebecca Wyatt is a 52 year old Caucasian female, married, lives in [place], has a history of anxiety symptoms, hyperlipidemia, GERD, presented to the clinic today for a follow-up visit.  Patient today reports she was able to return to work last week.  She reports she had some anxiety attacks when she started working.  She also did not show up at work 2 days .  Patient however is back at work.  She reports she continues to struggle with mood symptoms as well as sleep issues.  Sleep is a major concern for her.  She reports the Seroquel helps her to sleep but she has been sleeping excessively.  She reports she wakes up feeling groggy and tired.  Discussed changing her sleep medication.  She agrees with plan.  Patient denies any suicidality.  Patient denies any perceptual disturbances.  Patient continues to be in therapy with her therapist Dr. Caleen Essex.   Visit Diagnosis:    ICD-10-CM   1. Panic disorder without agoraphobia F41.0   2. MDD (major depressive disorder), single episode, moderate (HCC) F32.1     Past Psychiatric History: Reviewed past psychiatric history from my progress note on 04/13/2017.  Past Medical History:  Past Medical History:  Diagnosis Date  . Abdominal pain   . Anxiety   . Arthritis   . Back spasm   . Chills   . Depression   . Dizziness   . Fever   . Gallstones 01/14/2011  . Light-headedness   . Nausea & vomiting     Past Surgical History:  Procedure Laterality Date  . APPENDECTOMY  1982  . CHOLECYSTECTOMY  01/15/2011   Procedure: LAPAROSCOPIC CHOLECYSTECTOMY WITH INTRAOPERATIVE CHOLANGIOGRAM;  Surgeon: Iona Coach, MD;  Location: [facility];  Service: General;  Laterality: N/A;  . TUBAL LIGATION      Family Psychiatric History:Reviewed  Family psychiatric history from  my progress note on 04/13/2017  Family History:  Family History  Problem Relation Age of Onset  . COPD Mother   . Heart disease Mother   . Cancer Father        lung  . Cancer Maternal Aunt        bone, breast  . Cancer Maternal Uncle        lung  . Cancer Paternal Grandmother        breast  . Drug abuse Sister   . Alcohol abuse Brother   . Drug abuse Brother     Social History: Reviewed social history from my progress note on 04/13/2017. Social History   Socioeconomic History  . Marital status: Married    Spouse name: muel  . Number of children: 2  . Years of education: Not on file  . Highest education level: High school graduate  Occupational History    Comment: full time  Social Needs  . Financial resource strain: Not hard at all  . Food insecurity:    Worry: Never true    Inability: Never true  . Transportation needs:    Medical: No    Non-medical: No  Tobacco Use  . Smoking status: Current Every Day Smoker    Packs/day: 0.50    Types: Cigarettes  . Smokeless tobacco: Never Used  Substance and Sexual Activity  . Alcohol use: No  . Drug use:  No  . Sexual activity: Yes    Birth control/protection: None  Lifestyle  . Physical activity:    Days per week: 3 days    Minutes per session: 30 min  . Stress: Rather much  Relationships  . Social connections:    Talks on phone: Twice a week    Gets together: Never    Attends religious service: Never    Active member of club or organization: No    Attends meetings of clubs or organizations: Never    Relationship status: Married  Other Topics Concern  . Not on file  Social History Narrative  . Not on file    Allergies: No Known Allergies  Metabolic Disorder Labs: No results found for: HGBA1C, MPG No results found for: PROLACTIN No results found for: CHOL, TRIG, HDL, CHOLHDL, VLDL, LDLCALC No results found for: TSH  Therapeutic Level Labs: No results found for: LITHIUM No results found for:  VALPROATE No components found for:  CBMZ  Current Medications: Current Outpatient Medications  Medication Sig Dispense Refill  . atorvastatin (LIPITOR) 40 MG tablet Take 40 mg by mouth daily.  2  . busPIRone (BUSPAR) 5 MG tablet Take 1 tablet (5 mg total) by mouth 3 (three) times daily. 90 tablet 1  . sertraline (ZOLOFT) 100 MG tablet Take 2 tablets (200 mg total) by mouth daily. 60 tablet 1  . ARIPiprazole (ABILIFY) 2 MG tablet Take 1 tablet (2 mg total) by mouth at bedtime. 30 tablet 0   No current facility-administered medications for this visit.      Musculoskeletal: Strength & Muscle Tone: within normal limits Gait & Station: normal Patient leans: N/A  Psychiatric Specialty Exam: Review of Systems  Psychiatric/Behavioral: Positive for depression. The patient is nervous/anxious.   All other systems reviewed and are negative.   Blood pressure (!) 144/80, pulse 86, temperature 98.4 F (36.9 C), temperature source Oral, weight 204 lb 9.6 oz (92.8 kg).Body mass index is 33.02 kg/m.  General Appearance: Casual  Eye Contact:  Fair  Speech:  Normal Rate  Volume:  Normal  Mood:  Anxious  Affect:  Congruent  Thought Process:  Goal Directed and Descriptions of Associations: Intact  Orientation:  Full (Time, Place, and Person)  Thought Content: Logical   Suicidal Thoughts:  No  Homicidal Thoughts:  No  Memory:  Immediate;   Fair Recent;   Fair Remote;   Fair  Judgement:  Fair  Insight:  Fair  Psychomotor Activity:  Normal  Concentration:  Concentration: Fair and Attention Span: Fair  Recall:  [employer] of Knowledge: Fair  Language: Fair  Akathisia:  No  Handed:  Right  AIMS (if indicated): 0  Assets:  Communication Skills Desire for Improvement Social Support  ADL's:  Intact  Cognition: WNL  Sleep:  excessive   Screenings:   Assessment and Plan: [name] is a 52 year old female, married, lives in [place], has a history of recent stresses at work which caused  her to have anxiety attacks panic attacks as well as depressive symptoms.  Patient is biologically predisposed given her history of trauma as well as one suicide attempt as a child.  Patient was recently discharged from IOP program.  She continues to be in psychotherapy.  Patient is currently working with his therapist to return to work and has gone to work for a few days the past week.  Discussed medication changes as noted below.  Plan  Panic Attacks Continue Zoloft 200 mg p.o. daily Continue BuSpar 5  mg p.o. 3 times daily Add Abilify 2 mg p.o. nightly  MDD Discontinue Seroquel for side effects Continue Zoloft and BuSpar as prescribed Continue psychotherapy  For insomnia/excessive sleep Discontinue Seroquel for side effects.  She will return to clinic in few days.  She will continue psychotherapy with her therapist Dr. heading.  She will return to clinic in 5 days.  Follow-up in clinic as discussed.  More than 50 % of the time was spent for psychoeducation and supportive psychotherapy and care coordination.  This note was generated in part or whole with voice recognition software. Voice recognition is usually quite accurate but there are transcription errors that can and very often do occur. I apologize for any typographical errors that were not detected and corrected.      Jomarie Longs, MD 06/26/2017, 12:06 PM

## 2017-06-26 ENCOUNTER — Encounter: Payer: Self-pay | Admitting: Psychiatry

## 2017-07-01 ENCOUNTER — Ambulatory Visit: Payer: 59 | Admitting: Psychiatry

## 2017-07-09 ENCOUNTER — Ambulatory Visit: Payer: 59 | Admitting: Psychiatry

## 2017-07-09 ENCOUNTER — Encounter: Payer: Self-pay | Admitting: Psychiatry

## 2017-07-09 ENCOUNTER — Other Ambulatory Visit: Payer: Self-pay

## 2017-07-09 VITALS — BP 138/85 | HR 91 | Temp 97.4°F | Wt 205.0 lb

## 2017-07-09 DIAGNOSIS — F321 Major depressive disorder, single episode, moderate: Secondary | ICD-10-CM

## 2017-07-09 DIAGNOSIS — F410 Panic disorder [episodic paroxysmal anxiety] without agoraphobia: Secondary | ICD-10-CM

## 2017-07-09 DIAGNOSIS — F5105 Insomnia due to other mental disorder: Secondary | ICD-10-CM

## 2017-07-09 MED ORDER — DOXEPIN HCL 10 MG PO CAPS: 10.0000 mg | ORAL_CAPSULE | Freq: Every evening | ORAL | 1 refills | Status: DC | PRN

## 2017-07-09 MED ORDER — ARIPIPRAZOLE 2 MG PO TABS: 2.0000 mg | ORAL_TABLET | Freq: Every day | ORAL | 0 refills | Status: DC

## 2017-07-09 NOTE — Patient Instructions (Signed)
Doxepin capsules What is this medicine? DOXEPIN (DOX e pin) is used to treat depression and anxiety. This medicine may be used for other purposes; ask your health care provider or pharmacist if you have questions. COMMON BRAND NAME(S): Sinequan What should I tell my health care provider before I take this medicine? They need to know if you have any of these conditions: -bipolar disorder -difficulty passing urine -glaucoma -heart disease -if you frequently drink alcohol containing drinks -liver disease -lung or breathing disease, like asthma or sleep apnea -prostate trouble -schizophrenia -seizures -suicidal thoughts, plans, or attempt; a previous suicide attempt by you or a family member -an unusual or allergic reaction to doxepin, other medicines, foods, dyes, or preservatives -pregnant or trying to get pregnant -breast-feeding How should I use this medicine? Take this medicine by mouth with a glass of water. Follow the directions on the prescription label. Take your doses at regular intervals. Do not take your medicine more often than directed. Do not stop taking this medicine suddenly except upon the advice of your doctor. Stopping this medicine too quickly may cause serious side effects or your condition may worsen. A special MedGuide will be given to you by the pharmacist with each prescription and refill. Be sure to read this information carefully each time. Talk to your pediatrician regarding the use of this medicine in children. While this drug may be prescribed for children as young as 12 years for selected conditions, precautions do apply. Overdosage: If you think you have taken too much of this medicine contact a poison control center or emergency room at once. NOTE: This medicine is only for you. Do not share this medicine with others. What if I miss a dose? If you miss a dose, take it as soon as you can. If it is almost time for your next dose, take only that dose. Do not  take double or extra doses. What may interact with this medicine? Do not take this medicine with any of the following medications: -arsenic trioxide -certain medicines used to regulate abnormal heartbeat or to treat other heart conditions -cisapride -halofantrine -levomethadyl -linezolid -MAOIs like Carbex, Eldepryl, Marplan, Nardil, and Parnate -methylene blue -other medicines for mental depression -phenothiazines like perphenazine, thioridazine and chlorpromazine -pimozide -procarbazine -sparfloxacin -St. John's Wort -ziprasidone This medicine may also interact with the following medications: -cimetidine -tolazamide This list may not describe all possible interactions. Give your health care provider a list of all the medicines, herbs, non-prescription drugs, or dietary supplements you use. Also tell them if you smoke, drink alcohol, or use illegal drugs. Some items may interact with your medicine. What should I watch for while using this medicine? Visit your doctor or health care professional for regular checks on your progress. It can take several days before you feel the full effect of this medicine. If you have been taking this medicine regularly for some time, do not suddenly stop taking it. You must gradually reduce the dose or you may get severe side effects. Ask your doctor or health care professional for advice. Even after you stop taking this medicine it can still affect your body for several days. Patients and their families should watch out for new or worsening thoughts of suicide or depression. Also watch out for sudden changes in feelings such as feeling anxious, agitated, panicky, irritable, hostile, aggressive, impulsive, severely restless, overly excited and hyperactive, or not being able to sleep. If this happens, especially at the beginning of treatment or after a change in dose,   call your health care professional. You may get drowsy or dizzy. Do not drive, use machinery,  or do anything that needs mental alertness until you know how this medicine affects you. Do not stand or sit up quickly, especially if you are an older patient. This reduces the risk of dizzy or fainting spells. Alcohol may increase dizziness and drowsiness. Avoid alcoholic drinks. Do not treat yourself for coughs, colds, or allergies without asking your doctor or health care professional for advice. Some ingredients can increase possible side effects. Your mouth may get dry. Chewing sugarless gum or sucking hard candy, and drinking plenty of water may help. Contact your doctor if the problem does not go away or is severe. This medicine may cause dry eyes and blurred vision. If you wear contact lenses you may feel some discomfort. Lubricating drops may help. See your eye doctor if the problem does not go away or is severe. This medicine can make you more sensitive to the sun. Keep out of the sun. If you cannot avoid being in the sun, wear protective clothing and use sunscreen. Do not use sun lamps or tanning beds/booths. What side effects may I notice from receiving this medicine? Side effects that you should report to your doctor or health care professional as soon as possible: -allergic reactions like skin rash, itching or hives, swelling of the face, lips, or tongue -anxious -breathing problems -changes in vision -confusion -elevated mood, decreased need for sleep, racing thoughts, impulsive behavior -eye pain -fast, irregular heartbeat -feeling faint or lightheaded, falls -feeling agitated, angry, or irritable -fever with increased sweating -hallucination, loss of contact with reality -seizures -stiff muscles -suicidal thoughts or other mood changes -tingling, pain, or numbness in the feet or hands -trouble passing urine or change in the amount of urine -trouble sleeping -unusually weak or tired -vomiting -yellowing of the eyes or skin Side effects that usually do not require medical  attention (report to your doctor or health care professional if they continue or are bothersome): -change in sex drive or performance -change in appetite or weight -constipation -dizziness -dry mouth -nausea -tired -tremors -upset stomach This list may not describe all possible side effects. Call your doctor for medical advice about side effects. You may report side effects to FDA at 1-800-FDA-1088. Where should I keep my medicine? Keep out of the reach of children. Store at room temperature between 15 and 30 degrees C (59 and 86 degrees F). Throw away any unused medicine after the expiration date. NOTE: This sheet is a summary. It may not cover all possible information. If you have questions about this medicine, talk to your doctor, pharmacist, or health care provider.  2018 Elsevier/Gold Standard (2015-06-22 12:35:05)  

## 2017-07-09 NOTE — Progress Notes (Signed)
BH MD OP Progress Note  07/09/2017 4:20 PM Arn MedalLouvanda Wyatt  MRN:  [id number]  Chief Complaint: ' I am here for follow up." Chief Complaint    Follow-up; Medication Refill     HPI: [name] is a 52 year old Caucasian female, married, lives in [place], has a history of anxiety symptoms, hyperlipidemia, GERD, presented to the clinic today for a follow-up visit.  Patient today reports she has returned full-time to her work.  She reports work continues to be stressful at times.  She however reports she has been managing okay.  She continues to have some interpersonal issues at work.  She reports she sometimes  feels that her coworkers are ignoring her or they know about her.  Patient reports it is sometimes hard to cope with those thoughts.  Patient reports she continues to struggle with sleep.  She reports the Abilify is not helpful at all with her sleep.   she reports she tried taking the Zoloft 200 mg in the morning and that also did not help with her sleep problems.  She reports she has never been tried on doxepin before.  She is interested in the same.  Provided medication education.  Patient continues to be in psychotherapy with her therapist [name].  She denies any suicidality.  Patient denies any perceptual disturbances.  Patient continues to have good support system from her family. Visit Diagnosis:    ICD-10-CM   1. MDD (major depressive disorder), single episode, moderate (HCC) F32.1   2. Panic disorder without agoraphobia F41.0   3. Insomnia due to mental condition F51.05     Past Psychiatric History: Reviewed past psychiatric history from my progress note on 04/13/2017.  Past Medical History:  Past Medical History:  Diagnosis Date  . Abdominal pain   . Anxiety   . Arthritis   . Back spasm   . Chills   . Depression   . Dizziness   . Fever   . Gallstones 01/14/2011  . Light-headedness   . Nausea & vomiting     Past Surgical History:  Procedure Laterality Date   . APPENDECTOMY  1982  . CHOLECYSTECTOMY  01/15/2011   Procedure: LAPAROSCOPIC CHOLECYSTECTOMY WITH INTRAOPERATIVE CHOLANGIOGRAM;  Surgeon: [name], MD;  Location: [facility];  Service: General;  Laterality: N/A;  . TUBAL LIGATION      Family Psychiatric History: Reviewed family psychiatric history from my progress note on 04/13/2017.  Family History:  Family History  Problem Relation Age of Onset  . COPD Mother   . Heart disease Mother   . Cancer Father        lung  . Cancer Maternal Aunt        bone, breast  . Cancer Maternal Uncle        lung  . Cancer Paternal Grandmother        breast  . Drug abuse Sister   . Alcohol abuse Brother   . Drug abuse Brother     Social History: Reviewed Social history from my progress note on 04/13/2017. Social History   Socioeconomic History  . Marital status: Married    Spouse name: muel  . Number of children: 2  . Years of education: Not on file  . Highest education level: High school graduate  Occupational History    Comment: full time  Social Needs  . Financial resource strain: Not hard at all  . Food insecurity:    Worry: Never true    Inability: Never true  . Transportation  needs:    Medical: No    Non-medical: No  Tobacco Use  . Smoking status: Current Every Day Smoker    Packs/day: 0.50    Types: Cigarettes  . Smokeless tobacco: Never Used  Substance and Sexual Activity  . Alcohol use: No  . Drug use: No  . Sexual activity: Yes    Birth control/protection: None  Lifestyle  . Physical activity:    Days per week: 3 days    Minutes per session: 30 min  . Stress: Rather much  Relationships  . Social connections:    Talks on phone: Twice a week    Gets together: Never    Attends religious service: Never    Active member of club or organization: No    Attends meetings of clubs or organizations: Never    Relationship status: Married  Other Topics Concern  . Not on file  Social History Narrative  . Not on  file    Allergies: No Known Allergies  Metabolic Disorder Labs: No results found for: HGBA1C, MPG No results found for: PROLACTIN No results found for: CHOL, TRIG, HDL, CHOLHDL, VLDL, LDLCALC No results found for: TSH  Therapeutic Level Labs: No results found for: LITHIUM No results found for: VALPROATE No components found for:  CBMZ  Current Medications: Current Outpatient Medications  Medication Sig Dispense Refill  . ARIPiprazole (ABILIFY) 2 MG tablet Take 1 tablet (2 mg total) by mouth daily. 30 tablet 0  . atorvastatin (LIPITOR) 40 MG tablet Take 40 mg by mouth daily.  2  . busPIRone (BUSPAR) 5 MG tablet Take 1 tablet (5 mg total) by mouth 3 (three) times daily. 90 tablet 1  . doxepin (SINEQUAN) 10 MG capsule Take 1-2 capsules (10-20 mg total) by mouth at bedtime as needed. 30 capsule 1  . sertraline (ZOLOFT) 100 MG tablet Take 2 tablets (200 mg total) by mouth daily. 60 tablet 1   No current facility-administered medications for this visit.      Musculoskeletal: Strength & Muscle Tone: within normal limits Gait & Station: normal Patient leans: N/A  Psychiatric Specialty Exam: Review of Systems  HENT:       Dry mouth  Psychiatric/Behavioral: Positive for depression. The patient has insomnia.   All other systems reviewed and are negative.   Blood pressure 138/85, pulse 91, temperature (!) 97.4 F (36.3 C), temperature source Oral, weight 205 lb (93 kg).Body mass index is 33.09 kg/m.  General Appearance: Casual  Eye Contact:  Fair  Speech:  Clear and Coherent  Volume:  Normal  Mood:  Dysphoric  Affect:  Congruent  Thought Process:  Goal Directed and Descriptions of Associations: Intact  Orientation:  Full (Time, Place, and Person)  Thought Content: Logical   Suicidal Thoughts:  No  Homicidal Thoughts:  No  Memory:  Immediate;   Fair Recent;   Fair Remote;   Fair  Judgement:  Fair  Insight:  Fair  Psychomotor Activity:  Normal  Concentration:   Concentration: Fair and Attention Span: Fair  Recall:  [employer] of Knowledge: Fair  Language: Fair  Akathisia:  No  Handed:  Right  AIMS (if indicated): denies tremors , rigidity  Assets:  Communication Skills Desire for Improvement Housing Social Support  ADL's:  Intact  Cognition: WNL  Sleep:  Poor   Screenings:   Assessment and Plan: Alixandrea is a 52 year old female, married, lives in [place], has a history of recent stresses at work which caused her to have anxiety attacks,  panic attacks as well as depressive symptoms.  Patient however is back to work full-time and is trying to cope with her anxiety symptoms.  She continues to be in psychotherapy with her therapist.  Patient continues to have sleep problems and will make the following medication changes as noted below.  Patient is biologically predisposed given her history of trauma as well as one suicide attempt as a child.  Patient was recently discharged from IOP program.  Plan Panic attacks Continue Zoloft 200 mg p.o. daily Continue BuSpar 5 mg p.o. 3 times daily Continue Abilify 2 mg p.o. daily, advised her to take it during the day.  MDD  Zoloft, BuSpar and Abilify as prescribed Continue psychotherapy.  For insomnia Start doxepin 10-20 mg p.o. nightly as needed Provided medication education  Patient will continue psychotherapy with her therapist.  Follow-up in clinic in 2 weeks or sooner if needed.  More than 50 % of the time was spent for psychoeducation and supportive psychotherapy and care coordination.  This note was generated in part or whole with voice recognition software. Voice recognition is usually quite accurate but there are transcription errors that can and very often do occur. I apologize for any typographical errors that were not detected and corrected.        Jomarie Longs, MD 07/10/2017, 8:50 AM

## 2017-07-10 ENCOUNTER — Encounter: Payer: Self-pay | Admitting: Psychiatry

## 2017-07-30 ENCOUNTER — Ambulatory Visit: Payer: 59 | Admitting: Psychiatry

## 2017-08-26 ENCOUNTER — Other Ambulatory Visit: Payer: Self-pay | Admitting: Psychiatry

## 2017-10-10 ENCOUNTER — Other Ambulatory Visit: Payer: Self-pay | Admitting: Psychiatry

## 2017-10-11 ENCOUNTER — Other Ambulatory Visit: Payer: Self-pay | Admitting: Psychiatry

## 2017-10-29 DIAGNOSIS — N3281 Overactive bladder: Secondary | ICD-10-CM | POA: Diagnosis not present

## 2017-10-29 DIAGNOSIS — R32 Unspecified urinary incontinence: Secondary | ICD-10-CM | POA: Diagnosis not present

## 2017-12-08 DIAGNOSIS — Z1231 Encounter for screening mammogram for malignant neoplasm of breast: Secondary | ICD-10-CM | POA: Diagnosis not present

## 2017-12-08 DIAGNOSIS — Z01419 Encounter for gynecological examination (general) (routine) without abnormal findings: Secondary | ICD-10-CM | POA: Diagnosis not present

## 2017-12-08 DIAGNOSIS — Z6834 Body mass index (BMI) 34.0-34.9, adult: Secondary | ICD-10-CM | POA: Diagnosis not present

## 2017-12-10 ENCOUNTER — Other Ambulatory Visit: Payer: Self-pay | Admitting: Psychiatry

## 2017-12-10 DIAGNOSIS — F4323 Adjustment disorder with mixed anxiety and depressed mood: Secondary | ICD-10-CM

## 2017-12-10 DIAGNOSIS — F410 Panic disorder [episodic paroxysmal anxiety] without agoraphobia: Secondary | ICD-10-CM

## 2017-12-13 ENCOUNTER — Other Ambulatory Visit: Payer: Self-pay | Admitting: Psychiatry

## 2018-01-19 ENCOUNTER — Other Ambulatory Visit: Payer: Self-pay | Admitting: Psychiatry

## 2018-01-20 ENCOUNTER — Other Ambulatory Visit: Payer: Self-pay | Admitting: Psychiatry

## 2018-01-20 NOTE — Telephone Encounter (Signed)
Pt needs appointment

## 2018-02-25 ENCOUNTER — Ambulatory Visit: Payer: 59 | Admitting: Psychiatry

## 2018-02-25 ENCOUNTER — Encounter: Payer: Self-pay | Admitting: Psychiatry

## 2018-02-25 VITALS — BP 138/87 | HR 97 | Ht 64.5 in | Wt 204.0 lb

## 2018-02-25 DIAGNOSIS — F5105 Insomnia due to other mental disorder: Secondary | ICD-10-CM | POA: Diagnosis not present

## 2018-02-25 DIAGNOSIS — F410 Panic disorder [episodic paroxysmal anxiety] without agoraphobia: Secondary | ICD-10-CM | POA: Diagnosis not present

## 2018-02-25 DIAGNOSIS — F321 Major depressive disorder, single episode, moderate: Secondary | ICD-10-CM | POA: Diagnosis not present

## 2018-02-25 MED ORDER — ARIPIPRAZOLE 2 MG PO TABS: 2.0000 mg | ORAL_TABLET | Freq: Every day | ORAL | 0 refills | Status: DC

## 2018-02-25 MED ORDER — SERTRALINE HCL 100 MG PO TABS: 200.0000 mg | ORAL_TABLET | Freq: Every day | ORAL | 0 refills | Status: DC

## 2018-02-25 MED ORDER — BUSPIRONE HCL 5 MG PO TABS: 5.0000 mg | ORAL_TABLET | Freq: Three times a day (TID) | ORAL | 1 refills | Status: DC

## 2018-02-25 MED ORDER — ZALEPLON 5 MG PO CAPS: 5.0000 mg | ORAL_CAPSULE | Freq: Every evening | ORAL | 0 refills | Status: AC | PRN

## 2018-02-25 NOTE — Progress Notes (Signed)
BH MD OP Progress Note  02/25/2018 6:01 PM Rebecca Wyatt  MRN:  [id number]  Chief Complaint: ' I am here for follow up." Chief Complaint    Follow-up     HPI: Rebecca Wyatt is a 53 year old Caucasian female, married, [place] in [place], has a history of anxiety, hyperlipidemia, GERD, presented to clinic today for a follow-up visit.  Patient today reports she has been noncompliant with her medications as well as therapy visits.  She reports she could not come back to the clinic for a follow-up since June 2019 since she was back at work and could not find time to do so.  She reports she decided to come back since her panic attacks returned.  She currently reports panic symptoms on a regular basis.  Discussed with patient about the need for compliance.  Also discussed with her that she can be restarted on her Abilify and other medications like Zoloft and BuSpar.  She reports she never started the doxepin which was prescribed to her in June 2019 her last visit here.  Patient reports she continues to struggle with sleep.  She reports she does not want any medication that has a lingering effect in the morning.  Discussed starting Sonata.  She agrees with plan.  Patient denies any suicidality, homicidality or perceptual disturbances.  Discussed restarting psychotherapy sessions with therapist here in clinic.  Will give her a referral today. Visit Diagnosis:    ICD-10-CM   1. Panic disorder without agoraphobia F41.0 ARIPiprazole (ABILIFY) 2 MG tablet    busPIRone (BUSPAR) 5 MG tablet    sertraline (ZOLOFT) 100 MG tablet  2. MDD (major depressive disorder), single episode, moderate (HCC) F32.1 ARIPiprazole (ABILIFY) 2 MG tablet    busPIRone (BUSPAR) 5 MG tablet  3. Insomnia due to mental condition F51.05 zaleplon (SONATA) 5 MG capsule    Past Psychiatric History: Reviewed past psychiatric history from my progress note on 04/13/2017  Past Medical History:  Past Medical History:  Diagnosis Date   . Abdominal pain   . Anxiety   . Arthritis   . Back spasm   . Chills   . Depression   . Dizziness   . Fever   . Gallstones 01/14/2011  . Light-headedness   . Nausea & vomiting     Past Surgical History:  Procedure Laterality Date  . APPENDECTOMY  1982  . CHOLECYSTECTOMY  01/15/2011   Procedure: LAPAROSCOPIC CHOLECYSTECTOMY WITH INTRAOPERATIVE CHOLANGIOGRAM;  Surgeon: Rebecca CoachWilliam J Weatherly, MD;  Location: [facility];  Service: General;  Laterality: N/A;  . TUBAL LIGATION      Family Psychiatric History: Reviewed family psychiatric history from my progress note on 04/13/2017  Family History:  Family History  Problem Relation Age of Onset  . COPD Mother   . Heart disease Mother   . Cancer Father        lung  . Cancer Maternal Aunt        bone, breast  . Cancer Maternal Uncle        lung  . Cancer Paternal Grandmother        breast  . Drug abuse Sister   . Alcohol abuse Brother   . Drug abuse Brother     Social History: Reviewed social history from my progress note on 04/13/2017 Social History   Socioeconomic History  . Marital status: Married    Spouse name: muel  . Number of children: 2  . Years of education: Not on file  . Highest education level: High school  graduate  Occupational History    Comment: full time  Social Needs  . Financial resource strain: Not hard at all  . Food insecurity:    Worry: Never true    Inability: Never true  . Transportation needs:    Medical: No    Non-medical: No  Tobacco Use  . Smoking status: Current Every Day Smoker    Packs/day: 0.50    Years: 40.00    Pack years: 20.00    Types: Cigarettes  . Smokeless tobacco: Never Used  . Tobacco comment: Reports she has been trying to cut back to quit   Substance and Sexual Activity  . Alcohol use: No  . Drug use: No  . Sexual activity: Not Currently    Birth control/protection: None  Lifestyle  . Physical activity:    Days per week: 3 days    Minutes per session: 30 min  .  Stress: Rather much  Relationships  . Social connections:    Talks on phone: Twice a week    Gets together: Never    Attends religious service: Never    Active member of club or organization: No    Attends meetings of clubs or organizations: Never    Relationship status: Married  Other Topics Concern  . Not on file  Social History Narrative  . Not on file    Allergies: No Known Allergies  Metabolic Disorder Labs: No results found for: HGBA1C, MPG No results found for: PROLACTIN No results found for: CHOL, TRIG, HDL, CHOLHDL, VLDL, LDLCALC No results found for: TSH  Therapeutic Level Labs: No results found for: LITHIUM No results found for: VALPROATE No components found for:  CBMZ  Current Medications: Current Outpatient Medications  Medication Sig Dispense Refill  . atorvastatin (LIPITOR) 40 MG tablet Take 40 mg by mouth daily.  2  . sertraline (ZOLOFT) 100 MG tablet Take 2 tablets (200 mg total) by mouth daily. 180 tablet 0  . ARIPiprazole (ABILIFY) 2 MG tablet Take 1 tablet (2 mg total) by mouth daily. 90 tablet 0  . busPIRone (BUSPAR) 5 MG tablet Take 1 tablet (5 mg total) by mouth 3 (three) times daily. 90 tablet 1  . MYRBETRIQ 25 MG TB24 tablet Take 25 mg by mouth daily.    . zaleplon (SONATA) 5 MG capsule Take 1 capsule (5 mg total) by mouth at bedtime as needed for sleep. 30 capsule 0   No current facility-administered medications for this visit.      Musculoskeletal: Strength & Muscle Tone: within normal limits Gait & Station: normal Patient leans: N/A  Psychiatric Specialty Exam: Review of Systems  Psychiatric/Behavioral: The patient is nervous/anxious and has insomnia.   All other systems reviewed and are negative.   Blood pressure 138/87, pulse 97, height 5' 4.5" (1.638 m), weight 204 lb (92.5 kg).Body mass index is 34.48 kg/m.  General Appearance: Casual  Eye Contact:  Fair  Speech:  Clear and Coherent  Volume:  Normal  Mood:  Anxious  Affect:   Appropriate  Thought Process:  Goal Directed and Descriptions of Associations: Intact  Orientation:  Full (Time, Place, and Person)  Thought Content: Logical   Suicidal Thoughts:  No  Homicidal Thoughts:  No  Memory:  Immediate;   Fair Recent;   Fair Remote;   Fair  Judgement:  Fair  Insight:  Fair  Psychomotor Activity:  Normal  Concentration:  Concentration: Fair and Attention Span: Fair  Recall:  [employer] of Knowledge: Fair  Language:  Fair  Akathisia:  No  Handed:  Right  AIMS (if indicated): denies tremors, rigidity,stiffness  Assets:  Communication Skills Desire for Improvement Social Support  ADL's:  Intact  Cognition: WNL  Sleep:  Poor   Screenings:   Assessment and Plan: Elliza is a 53 year old female, married, lives in [place], has a history of panic attacks, depression, presented to the clinic today for a follow-up visit.  Patient has been noncompliant with treatment recommendations as well as follow-up visit.  Patient returns today with worsening panic symptoms.  Hence discussed restarting medications as noted below.  Plan Panic attacks-unstable Zoloft 200 mg p.o. daily-restarted. Restart BuSpar 5 mg p.o. 3 times daily. Restart Abilify 2 mg p.o. daily.  For MDD-unstable Restart medications as discussed above. Restart psychotherapy sessions.  Will refer her to therapist here in clinic today.  For insomnia-unstable She has been noncompliant with doxepin. Start Sonata 5 mg p.o. nightly.  Follow-up in clinic in 1 week or sooner if needed.  Provided her education about the need for compliance with treatment as well as follow-up visit.  Also discussed with her that if she is noncompliant she can be terminated from the clinic.  I have spent atleast 25 minutes  face to face with patient today. More than 50 % of the time was spent for psychoeducation and supportive psychotherapy and care coordination.  This note was generated in part or whole with voice  recognition software. Voice recognition is usually quite accurate but there are transcription errors that can and very often do occur. I apologize for any typographical errors that were not detected and corrected.          Jomarie Longs, MD 02/25/2018, 6:01 PM

## 2018-02-25 NOTE — Patient Instructions (Signed)
Zaleplon capsules What is this medicine? ZALEPLON (ZAL e plon) is used to treat insomnia. This medicine helps you to fall asleep. This medicine may be used for other purposes; ask your health care provider or pharmacist if you have questions. COMMON BRAND NAME(S): Sonata What should I tell my health care provider before I take this medicine? They need to know if you have any of these conditions: -depression -history of a drug or alcohol abuse problem -liver disease -lung or breathing disease -sleep-walking, driving, eating or other activity while not fully awake after taking a sleep medicine -suicidal thoughts -an unusual or allergic reaction to zaleplon, other medicines, foods, dyes, or preservatives -pregnant or trying to get pregnant -breast-feeding How should I use this medicine? Take this medicine by mouth with a glass of water. Follow the directions on the prescription label. It is better to take this medicine on an empty stomach and only when you are ready for bed. Do not take your medicine more often than directed. If you have been taking this medicine for several weeks and suddenly stop taking it, you may get unpleasant withdrawal symptoms. Your doctor or health care professional may want to gradually reduce the dose. Do not stop taking this medicine on your own. Always follow your doctor or health care professional's advice. Talk to your pediatrician regarding the use of this medicine in children. Special care may be needed. Overdosage: If you think you have taken too much of this medicine contact a poison control center or emergency room at once. NOTE: This medicine is only for you. Do not share this medicine with others. What if I miss a dose? This does not apply. This medicine should only be taken immediately before going to sleep. Do not take double or extra doses. What may interact with this medicine? -barbiturate medicines for inducing sleep or treating  seizures -carbamazepine -certain medicines for allergies, like azatadine, clemastine, diphenhydramine -certain medicines for depression, anxiety, or other emotional or psychiatric problems -certain medicines for pain -cimetidine -erythromycin -medicines for fungal infections like ketoconazole, fluconazole, or itraconazole -other medicines given for sleep -phenytoin -rifampin This list may not describe all possible interactions. Give your health care provider a list of all the medicines, herbs, non-prescription drugs, or dietary supplements you use. Also tell them if you smoke, drink alcohol, or use illegal drugs. Some items may interact with your medicine. What should I watch for while using this medicine? Visit your doctor or health care professional for regular checks on your progress. Keep a regular sleep schedule by going to bed at about the same time each night. Avoid caffeine-containing drinks in the evening hours. When sleep medicines are used every night for more than a few weeks, they may stop working. Talk to your doctor if you still have trouble sleeping. After taking this medicine, you may get up out of bed and do an activity that you do not know you are doing. The next morning, you may have no memory of this. Activities include driving a car ("sleep-driving"), making and eating food, talking on the phone, sexual activity, and sleep-walking. Serious injuries have occurred. Stop the medicine and call your doctor right away if you find out you have done any of these activities. Do not take this medicine if you have used alcohol that evening. Do not take it if you have taken another medicine for sleep. The risk of doing these sleep-related activities is higher. Do not take this medicine unless you are able to stay in   bed for a full night (7 to 8 hours) before you must be active again. You may have a decrease in mental alertness the day after use, even if you feel that you are fully awake.  Tell your doctor if you will need to perform activities requiring full alertness, such as driving, the next day. Do not stand or sit up quickly after taking this medicine, especially if you are an older patient. This reduces the risk of dizzy or fainting spells. If you or your family notice any changes in your behavior, such as new or worsening depression, thoughts of harming yourself, anxiety, other unusual or disturbing thoughts, or memory loss, call your doctor right away. After you stop taking this medicine, you may have trouble falling asleep. This is called rebound insomnia. This problem usually goes away on its own after 1 or 2 nights. What side effects may I notice from receiving this medicine? Side effects that you should report to your doctor or health care professional as soon as possible: -allergic reactions like skin rash, itching or hives, swelling of the face, lips, or tongue -breathing problems -changes in vision -confusion -depression, suicidal thoughts -feeling faint or lightheaded -hallucinations -hostility, restlessness, excitability -slurred speech -staggering, tremors -unusual activities while not fully awake like driving, eating, making phone calls -unusually weak or tired Side effects that usually do not require medical attention (report to your doctor or health care professional if they continue or are bothersome): -diarrhea -difficulty with coordination -loss of memory -nightmares -stomach upset This list may not describe all possible side effects. Call your doctor for medical advice about side effects. You may report side effects to FDA at 1-800-FDA-1088. Where should I keep my medicine? Keep out of the reach of children. This medicine can be abused. Keep your medicine in a safe place to protect it from theft. Do not share this medicine with anyone. Selling or giving away this medicine is dangerous and against the law. This medicine may cause accidental overdose  and death if taken by other adults, children, or pets. Mix any unused medicine with a substance like cat litter or coffee grounds. Then throw the medicine away in a sealed container like a sealed bag or a coffee can with a lid. Do not use the medicine after the expiration date. Store at room temperature between 20 and 25 degrees C (68 and 77 degrees F). Protect from light. NOTE: This sheet is a summary. It may not cover all possible information. If you have questions about this medicine, talk to your doctor, pharmacist, or health care provider.  2019 Elsevier/Gold Standard (2017-07-17 12:49:50)  

## 2018-03-04 ENCOUNTER — Ambulatory Visit: Payer: 59 | Admitting: Psychiatry

## 2018-03-09 DIAGNOSIS — H5213 Myopia, bilateral: Secondary | ICD-10-CM | POA: Diagnosis not present

## 2018-03-17 ENCOUNTER — Ambulatory Visit: Payer: 59 | Admitting: Licensed Clinical Social Worker

## 2018-03-23 ENCOUNTER — Other Ambulatory Visit: Payer: Self-pay | Admitting: Psychiatry

## 2018-03-23 DIAGNOSIS — F321 Major depressive disorder, single episode, moderate: Secondary | ICD-10-CM

## 2018-03-23 DIAGNOSIS — F410 Panic disorder [episodic paroxysmal anxiety] without agoraphobia: Secondary | ICD-10-CM

## 2018-05-22 ENCOUNTER — Other Ambulatory Visit: Payer: Self-pay | Admitting: Psychiatry

## 2018-05-22 DIAGNOSIS — F321 Major depressive disorder, single episode, moderate: Secondary | ICD-10-CM

## 2018-05-22 DIAGNOSIS — F410 Panic disorder [episodic paroxysmal anxiety] without agoraphobia: Secondary | ICD-10-CM

## 2018-06-09 DIAGNOSIS — R1013 Epigastric pain: Secondary | ICD-10-CM | POA: Diagnosis not present

## 2018-11-12 ENCOUNTER — Emergency Department (HOSPITAL_COMMUNITY)
Admission: EM | Admit: 2018-11-12 | Discharge: 2018-11-13 | Disposition: A | Discharge status: Home / Self Care | Payer: 59 | Attending: Emergency Medicine | Admitting: Emergency Medicine

## 2018-11-12 ENCOUNTER — Emergency Department (HOSPITAL_COMMUNITY): Payer: 59

## 2018-11-12 ENCOUNTER — Encounter (HOSPITAL_COMMUNITY): Payer: Self-pay | Admitting: *Deleted

## 2018-11-12 ENCOUNTER — Other Ambulatory Visit: Payer: Self-pay

## 2018-11-12 DIAGNOSIS — F17210 Nicotine dependence, cigarettes, uncomplicated: Secondary | ICD-10-CM | POA: Insufficient documentation

## 2018-11-12 DIAGNOSIS — R202 Paresthesia of skin: Secondary | ICD-10-CM | POA: Insufficient documentation

## 2018-11-12 DIAGNOSIS — R519 Headache, unspecified: Secondary | ICD-10-CM | POA: Insufficient documentation

## 2018-11-12 DIAGNOSIS — R42 Dizziness and giddiness: Secondary | ICD-10-CM | POA: Diagnosis not present

## 2018-11-12 DIAGNOSIS — H9202 Otalgia, left ear: Secondary | ICD-10-CM

## 2018-11-12 LAB — COMPREHENSIVE METABOLIC PANEL
ALT: 38 U/L (ref 0–44)
AST: 20 U/L (ref 15–41)
Albumin: 3.8 g/dL (ref 3.5–5.0)
Alkaline Phosphatase: 88 U/L (ref 38–126)
Anion gap: 12 (ref 5–15)
BUN: 10 mg/dL (ref 6–20)
CO2: 23 mmol/L (ref 22–32)
Calcium: 9.6 mg/dL (ref 8.9–10.3)
Chloride: 99 mmol/L (ref 98–111)
Creatinine, Ser: 0.72 mg/dL (ref 0.44–1.00)
GFR calc Af Amer: >60 mL/min (ref >60)
GFR calc non Af Amer: >60 mL/min (ref >60)
Glucose, Bld: 361 mg/dL — ABNORMAL HIGH (ref 70–99)
Potassium: 3.4 mmol/L — ABNORMAL LOW (ref 3.5–5.1)
Sodium: 134 mmol/L — ABNORMAL LOW (ref 135–145)
Total Bilirubin: 0.8 mg/dL (ref 0.3–1.2)
Total Protein: 6.5 g/dL (ref 6.5–8.1)

## 2018-11-12 LAB — CBC WITH DIFFERENTIAL/PLATELET
Abs Immature Granulocytes: 0.07 10*3/uL (ref 0.00–0.07)
Basophils Absolute: 0.1 10*3/uL (ref 0.0–0.1)
Basophils Relative: 1 %
Eosinophils Absolute: 0.2 10*3/uL (ref 0.0–0.5)
Eosinophils Relative: 1 %
HCT: 51.1 % — ABNORMAL HIGH (ref 36.0–46.0)
Hemoglobin: 17.4 g/dL — ABNORMAL HIGH (ref 12.0–15.0)
Immature Granulocytes: 1 %
Lymphocytes Relative: 24 %
Lymphs Abs: 3.3 10*3/uL (ref 0.7–4.0)
MCH: 28.8 pg (ref 26.0–34.0)
MCHC: 34.1 g/dL (ref 30.0–36.0)
MCV: 84.6 fL (ref 80.0–100.0)
Monocytes Absolute: 0.7 10*3/uL (ref 0.1–1.0)
Monocytes Relative: 5 %
Neutro Abs: 9.6 10*3/uL — ABNORMAL HIGH (ref 1.7–7.7)
Neutrophils Relative %: 68 %
Platelets: 275 10*3/uL (ref 150–400)
RBC: 6.04 MIL/uL — ABNORMAL HIGH (ref 3.87–5.11)
RDW: 12 % (ref 11.5–15.5)
WBC: 13.9 10*3/uL — ABNORMAL HIGH (ref 4.0–10.5)
nRBC: 0 % (ref 0.0–0.2)

## 2018-11-12 LAB — SEDIMENTATION RATE: Sed Rate: 1 mm/hr (ref 0–22)

## 2018-11-12 NOTE — ED Notes (Signed)
Pt to [facility] desk, does not want to wait any longer. Advised pt to stay. Pt refused.

## 2018-11-12 NOTE — ED Triage Notes (Signed)
PT tx by her pcp for L ear pain. However, pain stopped when pt was placed on steroids, but increased the day after she stopped taking steroids.  Now c/o tingling to L side of lips and pain to L temple with some blurred vision in the L eye.

## 2018-11-13 ENCOUNTER — Emergency Department (HOSPITAL_COMMUNITY): Payer: 59

## 2018-11-13 MED ORDER — ACETAMINOPHEN 500 MG PO TABS
1000.0000 mg | ORAL_TABLET | Freq: Once | ORAL | Status: AC
  Administered 2018-11-13: 1000 mg via ORAL
  Filled 2018-11-13: qty 2

## 2018-11-13 MED ORDER — GADOBUTROL 1 MMOL/ML IV SOLN
9.0000 mL | Freq: Once | INTRAVENOUS | Status: AC | PRN
  Administered 2018-11-13: 9 mL via INTRAVENOUS

## 2018-11-13 MED ORDER — CEPHALEXIN 500 MG PO CAPS: 500.0000 mg | ORAL_CAPSULE | Freq: Four times a day (QID) | ORAL | 0 refills | Status: AC

## 2018-11-13 MED ORDER — CEPHALEXIN 250 MG PO CAPS
1000.0000 mg | ORAL_CAPSULE | Freq: Once | ORAL | Status: AC
  Administered 2018-11-13: 1000 mg via ORAL
  Filled 2018-11-13: qty 4

## 2018-11-13 NOTE — ED Provider Notes (Signed)
Emergency Department Provider Note   I have reviewed the triage vital signs and the nursing notes.   HISTORY  Chief Complaint Otalgia and Numbness   HPI Rebecca Wyatt is a 53 y.o. female who presents emerged from today with 2 weeks of ear pain sinus pain headache and now some tingling in the left side of her face.  Patient states that she had an ear infection/sinusitis was treated for that.  She also went to see a dentist who removed the infected tooth patient has no relief. The only time she did have some relief is with steroids but she did not like the side effects of those.  She states she is also had some left arm pain and some left leg pain but this is chronic in nature.   No other associated or modifying symptoms.    Past Medical History:  Diagnosis Date   Abdominal pain    Anxiety    Arthritis    Back spasm    Chills    Depression    Dizziness    Fever    Gallstones 01/14/2011   Light-headedness    Nausea & vomiting     Patient Active Problem List   Diagnosis Date Noted   Gallbladder calculus with acute cholecystitis 01/15/2011   Gallstones 01/14/2011    Past Surgical History:  Procedure Laterality Date   APPENDECTOMY  1982   CHOLECYSTECTOMY  01/15/2011   Procedure: LAPAROSCOPIC CHOLECYSTECTOMY WITH INTRAOPERATIVE CHOLANGIOGRAM;  Surgeon: Iona Coach, MD;  Location: [facility];  Service: General;  Laterality: N/A;   TUBAL LIGATION      Current Outpatient Rx   Order #: [id number] Class: Normal   Order #: [id number] Class: Historical Med   Order #: [id number] Class: Normal   Order #: [id number] Class: Normal   Order #: [id number] Class: Historical Med   Order #: [id number] Class: Normal   Order #: [id number] Class: Normal    Allergies Patient has no known allergies.  Family History  Problem Relation Age of Onset   COPD Mother    Heart disease Mother    Cancer Father        lung   Cancer Maternal Aunt        bone, breast    Cancer Maternal Uncle        lung   Cancer Paternal Grandmother        breast   Drug abuse Sister    Alcohol abuse Brother    Drug abuse Brother     Social History Social History   Tobacco Use   Smoking status: Current Every Day Smoker    Packs/day: 0.50    Years: 40.00    Pack years: 20.00    Types: Cigarettes   Smokeless tobacco: Never Used   Tobacco comment: Reports she has been trying to cut back to quit   Substance Use Topics   Alcohol use: No   Drug use: No    Review of Systems  All other systems negative except as documented in the HPI. All pertinent positives and negatives as reviewed in the HPI. ____________________________________________   PHYSICAL EXAM:  VITAL SIGNS: ED Triage Vitals  Enc Vitals Group     BP 11/12/18 1650 (!) 151/88     Pulse Rate 11/12/18 1650 (!) 113     Resp 11/12/18 1650 18     Temp 11/12/18 1650 98.1 F (36.7 C)     Temp Source 11/12/18 1650 Oral     SpO2 11/12/18 1650 98 %  Weight 11/12/18 1715 202 lb (91.6 kg)     Height 11/12/18 1715 5\' 6"  (1.676 m)    Constitutional: Alert and oriented. Well appearing and in no acute distress. Ears: white fluid behind left TM without erythema or bulgint of TM.  Eyes: Conjunctivae are normal. PERRL. EOMI. Head: Atraumatic. Nose: No congestion/rhinnorhea. Mouth/Throat: Mucous membranes are moist.  Oropharynx non-erythematous. Neck: No stridor.  No meningeal signs.   Cardiovascular: Normal rate, regular rhythm. Good peripheral circulation. Grossly normal heart sounds.   Respiratory: Normal respiratory effort.  No retractions. Lungs CTAB. Gastrointestinal: Soft and nontender. No distention.  Musculoskeletal: No lower extremity tenderness nor edema. No gross deformities of extremities. Neurologic:  No altered mental status, able to give full seemingly accurate history.  Face is symmetric, EOM's intact, pupils equal and reactive, vision intact, tongue and uvula midline without  deviation. Upper and Lower extremity motor 5/5, intact pain perception in distal extremities, 2+ reflexes in biceps, patella and achilles tendons. Able to perform finger to nose normal with both hands. Walks without assistance or evident ataxia.  Skin:  Skin is warm, dry and intact. No rash noted.   ____________________________________________   LABS (all labs ordered are listed, but only abnormal results are displayed)  Labs Reviewed  CBC WITH DIFFERENTIAL/PLATELET - Abnormal; Notable for the following components:      Result Value   WBC 13.9 (*)    RBC 6.04 (*)    Hemoglobin 17.4 (*)    HCT 51.1 (*)    Neutro Abs 9.6 (*)    All other components within normal limits  COMPREHENSIVE METABOLIC PANEL - Abnormal; Notable for the following components:   Sodium 134 (*)    Potassium 3.4 (*)    Glucose, Bld 361 (*)    All other components within normal limits  SEDIMENTATION RATE   ____________________________________________  EKG   EKG Interpretation  Date/Time:    Ventricular Rate:    PR Interval:    QRS Duration:   QT Interval:    QTC Calculation:   R Axis:     Text Interpretation:         ____________________________________________  RADIOLOGY  Ct Head Wo Contrast  Result Date: 11/12/2018 CLINICAL DATA:  Tingling to the left side of the lips and pain to the left temple. EXAM: CT HEAD WITHOUT CONTRAST TECHNIQUE: Contiguous axial images were obtained from the base of the skull through the vertex without intravenous contrast. COMPARISON:  None. FINDINGS: Brain: No evidence of acute infarction, hemorrhage, hydrocephalus, extra-axial collection or mass lesion/mass effect. Vascular: No hyperdense vessel or unexpected calcification. Skull: Normal. Negative for fracture or focal lesion. Sinuses/Orbits: No acute finding. Other: None. IMPRESSION: No acute intracranial abnormality. Electronically Signed   By: Ted Mcalpineobrinka  Dimitrova M.D.   On: 11/12/2018 18:33   Mr Laqueta JeanBrain W UEWo  Contrast  Result Date: 11/13/2018 CLINICAL DATA:  Blurry left eye vision. Left-sided facial tingling. EXAM: MRI HEAD WITHOUT AND WITH CONTRAST TECHNIQUE: Multiplanar, multiecho pulse sequences of the brain and surrounding structures were obtained without and with intravenous contrast. CONTRAST:  9mL GADAVIST GADOBUTROL 1 MMOL/ML IV SOLN COMPARISON:  Head CT 11/12/2018 FINDINGS: BRAIN: There is no acute infarct, acute hemorrhage or extra-axial collection. The white matter signal is normal for the patient's age. The cerebral and cerebellar volume are age-appropriate. There is no hydrocephalus. The midline structures are normal. VASCULAR: The major intracranial arterial and venous sinus flow voids are normal. Susceptibility-sensitive sequences show no chronic microhemorrhage or superficial siderosis. SKULL AND UPPER CERVICAL  SPINE: Calvarial bone marrow signal is normal. There is no skull base mass. The visualized upper cervical spine and soft tissues are normal. SINUSES/ORBITS: There are no fluid levels or advanced mucosal thickening. The mastoid air cells and middle ear cavities are free of fluid. The orbits are normal. IMPRESSION: Normal MRI of the brain. Electronically Signed   By: Ulyses Jarred M.D.   On: 11/13/2018 03:45    ____________________________________________   PROCEDURES  Procedure(s) performed:   Procedures   ____________________________________________   INITIAL IMPRESSION / ASSESSMENT AND PLAN / ED COURSE  Mri without evidence of intracranial extension from sinus/ear infection. No stroke. Will restart abx and fu w/ ENT for same.      Pertinent labs & imaging results that were available during my care of the patient were reviewed by me and considered in my medical decision making (see chart for details).  A medical screening exam was performed and I feel the patient has had an appropriate workup for their chief complaint at this time and likelihood of emergent condition  existing is low. They have been counseled on decision, discharge, follow up and which symptoms necessitate immediate return to the emergency department. They or their family verbally stated understanding and agreement with plan and discharged in stable condition.   ____________________________________________  FINAL CLINICAL IMPRESSION(S) / ED DIAGNOSES  Final diagnoses:  Paresthesias  Left ear pain     MEDICATIONS GIVEN DURING THIS VISIT:  Medications  gadobutrol (GADAVIST) 1 MMOL/ML injection 9 mL (9 mLs Intravenous Contrast Given 11/13/18 0233)  acetaminophen (TYLENOL) tablet 1,000 mg (1,000 mg Oral Given 11/13/18 0447)  cephALEXin (KEFLEX) capsule 1,000 mg (1,000 mg Oral Given 11/13/18 0447)     NEW OUTPATIENT MEDICATIONS STARTED DURING THIS VISIT:  Discharge Medication List as of 11/13/2018  4:21 AM    START taking these medications   Details  cephALEXin (KEFLEX) 500 MG capsule Take 1 capsule (500 mg total) by mouth 4 (four) times daily., Starting Sat 11/13/2018, Normal        Note:  This note was prepared with assistance of Dragon voice recognition software. Occasional wrong-word or sound-a-like substitutions may have occurred due to the inherent limitations of voice recognition software.   Christyne Mccain, Corene Cornea, MD 11/13/18 [phone]

## 2018-11-13 NOTE — ED Notes (Signed)
Patient verbalizes understanding of discharge instructions. Opportunity for questioning and answers were provided. Armband removed by staff, pt discharged from ED. Ambulated out to lobby  

## 2018-12-21 ENCOUNTER — Encounter: Payer: Self-pay | Admitting: Neurology

## 2018-12-22 ENCOUNTER — Telehealth: Payer: Self-pay | Admitting: Neurology

## 2018-12-22 ENCOUNTER — Other Ambulatory Visit: Payer: Self-pay

## 2018-12-22 ENCOUNTER — Telehealth (INDEPENDENT_AMBULATORY_CARE_PROVIDER_SITE_OTHER): Payer: 59 | Admitting: Neurology

## 2018-12-22 VITALS — Ht 66.0 in | Wt 200.0 lb

## 2018-12-22 DIAGNOSIS — R202 Paresthesia of skin: Secondary | ICD-10-CM | POA: Diagnosis not present

## 2018-12-22 NOTE — Progress Notes (Addendum)
New Patient Virtual Visit via Video Note The purpose of this virtual visit is to provide medical care while limiting exposure to the novel coronavirus.    Consent was obtained for video visit:  Yes.   Answered questions that patient had about telehealth interaction:  Yes.   I discussed the limitations, risks, security and privacy concerns of performing an evaluation and management service by telemedicine. I also discussed with the patient that there may be a patient responsible charge related to this service. The patient expressed understanding and agreed to proceed.  Pt location: Home Physician Location: office Name of referring provider:  Milus Height, PA-C I connected with Arn Medal at patients initiation/request on 12/22/2018 at  9:10 AM EST by video enabled telemedicine application and verified that I am speaking with the correct person using two identifiers. Pt MRN:  [id number] Pt DOB:  Apr 26, 1965 Video Participants:  Arn Medal    History of Present Illness: Rebecca Wyatt is a 53 y.o. right-handed female with anxiety/depression, hyperlipidemia and GERD presenting for evaluation of paresthesias.   In September she developed left ear pain and numbness over the face.  She was initially treated with antibiotics and prednisone, which did not resolve symptoms. She went to the ER where MRI brain wwo contrast was normal.  Because symptoms did not improve, she was evaluated by ENT with normal evaluation.  She was given a trial of gabapentin which helped with headaches and resolved ear pain, but no change in facial numbness.  Over the past week, she developed spells of numbness involving the left shoulder, arm, chest, and upper thigh.  It does not extend below the knee.  This lasts about 30-min, occurring about once per week.  There is no specific pattern or trigger.  Sometimes she has tingling in the right hand.  Overall, the intensity of her facial numbness has significantly  improved, but it does not feel back to her normal baseline.  Of note, patient does have depression.  She was prescribed abilify and buspar by psychiatry but stopped this 3-4 months ago.  She continues to take sertraline 100mg  daily.   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    Out-side paper records, electronic medical record, and images have been reviewed where available and summarized as:  MRI brain wwo contrast 11/13/2018:  Normal  Lab Results  Component Value Date   ESRSEDRATE 1 11/12/2018    Past Medical History:  Diagnosis Date   Abdominal pain    Anxiety    Arthritis    Back spasm    Chills    Depression    Dizziness    Fever    Gallstones 01/14/2011   Light-headedness    Nausea & vomiting     Past Surgical History:  Procedure Laterality Date   APPENDECTOMY  1982   CHOLECYSTECTOMY  01/15/2011   Procedure: LAPAROSCOPIC CHOLECYSTECTOMY WITH INTRAOPERATIVE CHOLANGIOGRAM;  Surgeon: 14/01/2011, MD;  Location: [facility];  Service: General;  Laterality: N/A;   TUBAL LIGATION       Medications:  Outpatient Encounter Medications as of 12/22/2018  Medication Sig   atorvastatin (LIPITOR) 40 MG tablet Take 40 mg by mouth daily.   MYRBETRIQ 25 MG TB24 tablet Take 25 mg by mouth daily.   sertraline (ZOLOFT) 100 MG tablet TAKE 2 TABLETS BY MOUTH EVERY DAY   ARIPiprazole (ABILIFY) 2 MG tablet TAKE 1 TABLET BY MOUTH EVERY DAY (Patient not taking: Reported on 12/22/2018)   busPIRone (BUSPAR) 5 MG tablet  TAKE 1 TABLET BY MOUTH THREE TIMES A DAY (Patient not taking: Reported on 12/22/2018)   cephALEXin (KEFLEX) 500 MG capsule Take 1 capsule (500 mg total) by mouth 4 (four) times daily. (Patient not taking: Reported on 12/22/2018)   zaleplon (SONATA) 5 MG capsule Take 1 capsule (5 mg total) by mouth at bedtime as needed for sleep. (Patient not taking: Reported on 12/21/2018)   No facility-administered encounter medications on file as of 12/22/2018.      Allergies: No Known Allergies  Family History: Family History  Problem Relation Age of Onset   COPD Mother    Heart disease Mother    Cancer Father        lung   Cancer Maternal Aunt        bone, breast   Cancer Maternal Uncle        lung   Cancer Paternal Grandmother        breast   Drug abuse Sister    Alcohol abuse Brother    Drug abuse Brother     Social History: Social History   Tobacco Use   Smoking status: Current Every Day Smoker    Packs/day: 0.50    Years: 40.00    Pack years: 20.00    Types: Cigarettes   Smokeless tobacco: Never Used   Tobacco comment: Reports she has been trying to cut back to quit   Substance Use Topics   Alcohol use: No   Drug use: No   Social History   Social History Narrative   Right handed   One story home    Review of Systems:  CONSTITUTIONAL: No fevers, chills, night sweats, or weight loss.   EYES: No visual changes or eye pain ENT: No hearing changes.  No history of nose bleeds.   RESPIRATORY: No cough, wheezing and shortness of breath.   CARDIOVASCULAR: Negative for chest pain, and palpitations.   GI: Negative for abdominal discomfort, blood in stools or black stools.  No recent change in bowel habits.   GU:  No history of incontinence.   MUSCLOSKELETAL: No history of joint pain or swelling.  No myalgias.   SKIN: Negative for lesions, rash, and itching.   HEMATOLOGY/ONCOLOGY: Negative for prolonged bleeding, bruising easily, and swollen nodes.  No history of cancer.   ENDOCRINE: Negative for cold or heat intolerance, polydipsia or goiter.   PSYCH:  +depression or anxiety symptoms.   NEURO: As Above.   Vital Signs:  Ht 5\' 6"  (1.676 m)    Wt 200 lb (90.7 kg)    BMI 32.28 kg/m    General Medical Exam:  Well appearing, comfortable.  Nonlabored breathing.  No deformity or edema.  No rash.  Neurological Exam: MENTAL STATUS including orientation to time, place, person, recent and remote memory,  attention span and concentration, language, and fund of knowledge is normal.  Speech is not dysarthric.  CRANIAL NERVES:  Normal conjugate, extra-ocular eye movements in all directions of gaze.  No ptosis.  Normal facial symmetry and movements.  Normal shoulder shrug and head rotation.  Tongue is midline.  MOTOR:  Antigravity in all extremities.  No abnormal movements.  No pronator drift.   SENSORY: Unable to assess.   Romberg's sign absent.   COORDINATION/GAIT: Normal finger to nose bilaterally.  Intact rapid alternating movements bilaterally.  Able to rise from a chair without using arms.  Gait narrow based and stable. Stressed gait intact.    IMPRESSION/PLAN: Left facial numbness with episodic left arm and  leg numbness.  MRI brain wwo contrast is negative, specifically no evidence of demyelinating disease or structural pathology to explain symptoms.  I have personally viewed her MRI and agree with findings.  She will return to the office for electrodiagnostic testing of the left arm and leg. Patient was reassured that with normal MRI brain, worrisome pathology such as stroke, tumor, or MS has been excluded. If EDX is normal and she continues to have numbness, check CTA head and neck to assess vessels.  She may return to work without restrictions, letter will be provided.    Follow Up Instructions:  I discussed the assessment and treatment plan with the patient. The patient was provided an opportunity to ask questions and all were answered. The patient agreed with the plan and demonstrated an understanding of the instructions.   The patient was advised to call back or seek an in-person evaluation if the symptoms worsen or if the condition fails to improve as anticipated.  Total Time spent:  45 min   [place] Chardonika K Liban Guedes, DO

## 2018-12-22 NOTE — Telephone Encounter (Signed)
Called and left patient a message to call back re: schedule EMG + follow-up on the same day. EMG of LUE and LLE.

## 2018-12-24 NOTE — Telephone Encounter (Signed)
Left patient a message to call back and reschedule 01/11/19 appointment. She needs a follow up slot before the EMG.

## 2019-01-11 ENCOUNTER — Encounter: Payer: 59 | Admitting: Neurology

## 2019-01-20 ENCOUNTER — Encounter: Payer: 59 | Admitting: Neurology

## 2019-01-20 ENCOUNTER — Ambulatory Visit: Payer: 59 | Admitting: Neurology

## 2019-01-20 ENCOUNTER — Encounter

## 2020-05-03 IMAGING — MR MR HEAD WO/W CM
12 of 14 series · 40 of 48 positions shown · IV contrast (Contrast agent)
Comparison: Head CT 11/12/2018

CLINICAL DATA: Blurry left eye vision. Left-sided facial tingling.

EXAM:
MRI HEAD WITHOUT AND WITH CONTRAST
TECHNIQUE: Multiplanar, multiecho pulse sequences of the brain and surrounding
structures were obtained without and with intravenous contrast.
CONTRAST:  9mL GADAVIST GADOBUTROL 1 MMOL/ML IV SOLN

[Series 5: DWI · axial · 3.0mm · 0.88mm/px · z∈[-81,+58]mm · 8 of 96 slices shown (1 of 4)]
[im 1/96]
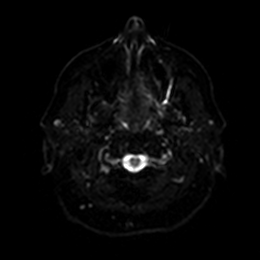
[im 14/96]
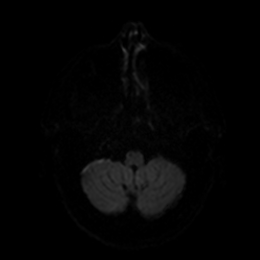
[im 28/96]
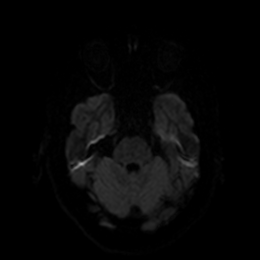
[im 41/96]
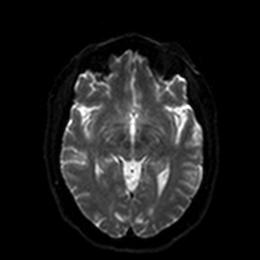
[im 55/96]
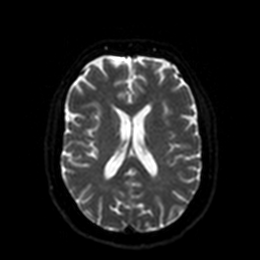
[im 68/96]
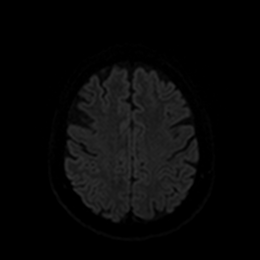
[im 82/96]
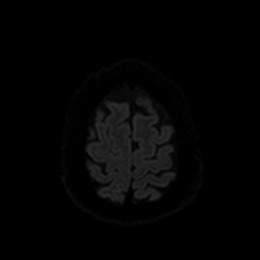
[im 96/96]
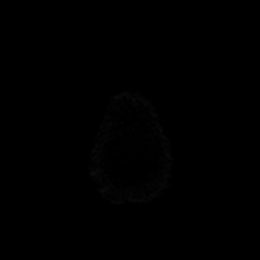

[Series 6: DWI · axial · 3.0mm · 0.88mm/px · z∈[-81,+58]mm · 4 of 48 slices shown (2 of 4)]
[im 1/48]
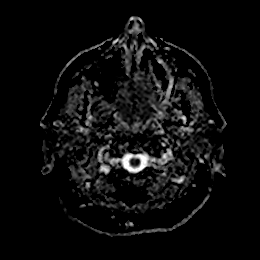
[im 16/48]
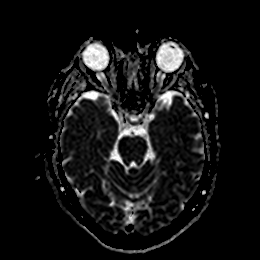
[im 32/48]
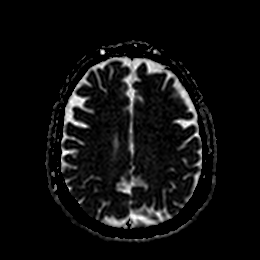
[im 48/48]
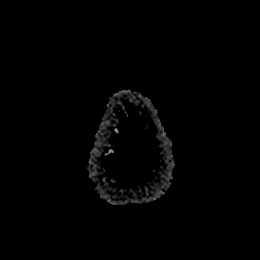

[Series 7: DWI · coronal · 4.0mm · 0.88mm/px · 5 of 68 slices shown (3 of 4)]
[im 1/68]
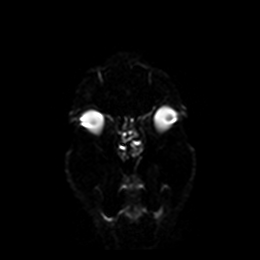
[im 17/68]
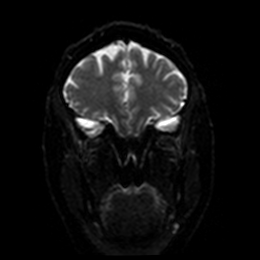
[im 34/68]
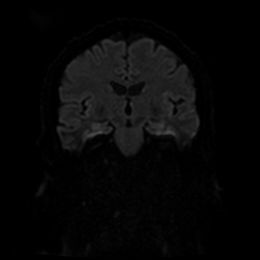
[im 51/68]
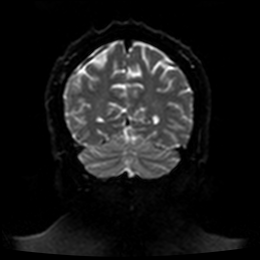
[im 68/68]
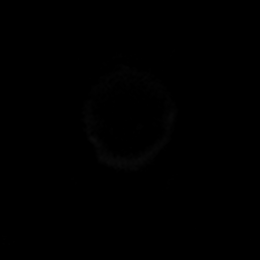

[Series 8: DWI · coronal · 4.0mm · 0.88mm/px · 3 of 34 slices shown (4 of 4)]
[im 1/34]
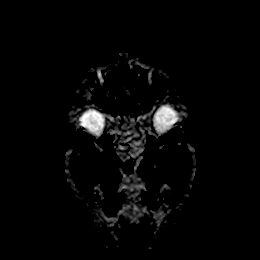
[im 17/34]
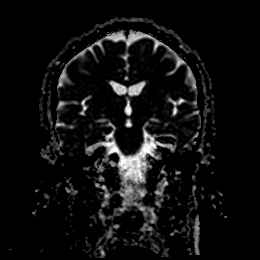
[im 34/34]
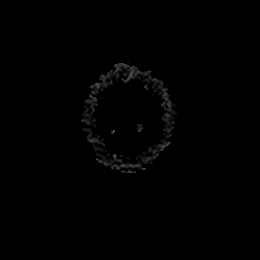

[Series 9: T1 · sagittal · 5.0mm · 0.75mm/px · 2 of 25 slices shown]
[im 1/25]
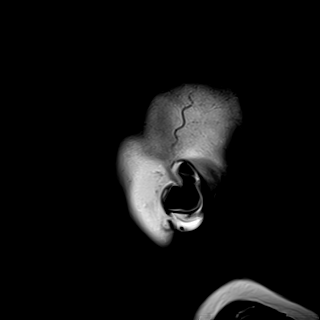
[im 25/25]
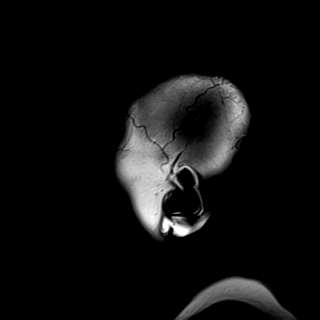

[Series 10: T2 · axial · 5.0mm · 0.72mm/px · z∈[-84,+59]mm · 2 of 25 slices shown]
[im 1/25]
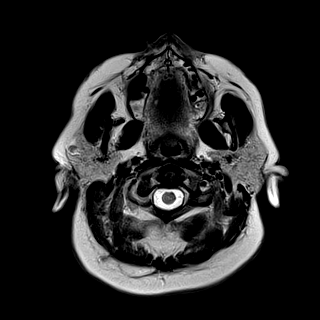
[im 25/25]
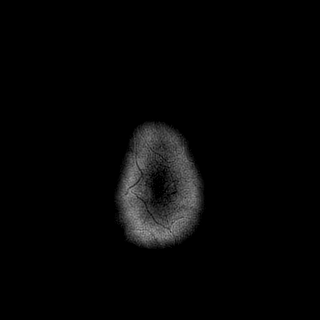

[Series 11: FLAIR · axial · 5.0mm · 0.45mm/px · z∈[-84,+58]mm · 2 of 25 slices shown]
[im 1/25]
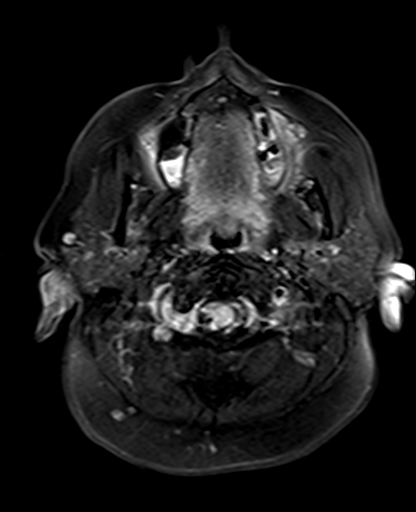
[im 25/25]
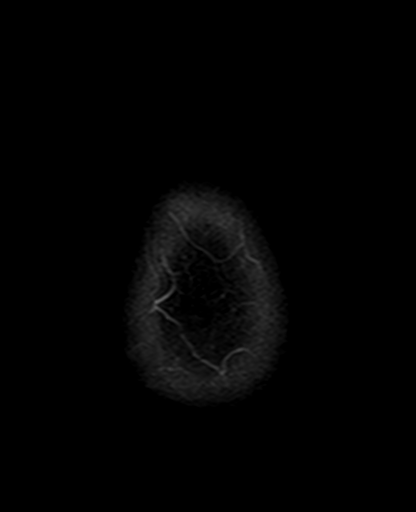

[Series 13: pha_images · axial · 3.0mm · 0.90mm/px · z∈[-103,+72]mm · 4 of 60 slices shown]
[im 1/60]
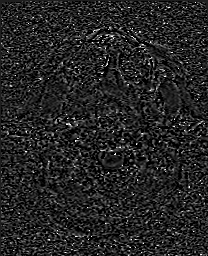
[im 20/60]
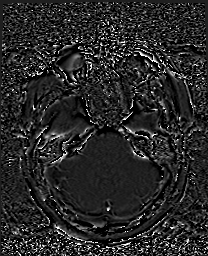
[im 40/60]
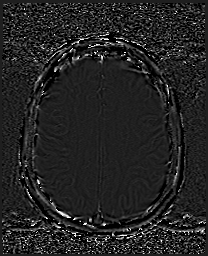
[im 60/60]
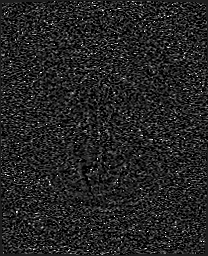

[Series 14: swi_images · axial · 3.0mm · 0.90mm/px · z∈[-103,+72]mm · 4 of 60 slices shown]
[im 1/60]
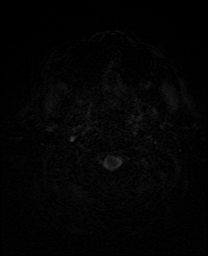
[im 20/60]
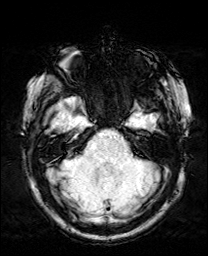
[im 40/60]
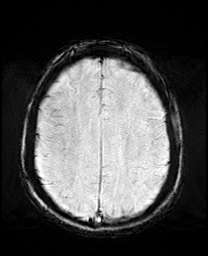
[im 60/60]
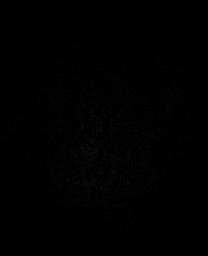

[Series 17: T2 post-contrast · coronal · 5.0mm · 0.72mm/px · 2 of 29 slices shown]
[im 1/29]
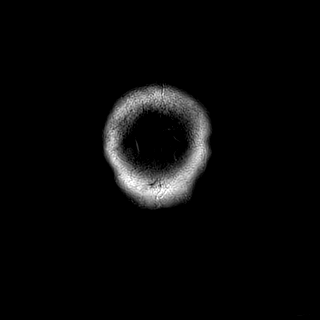
[im 29/29]
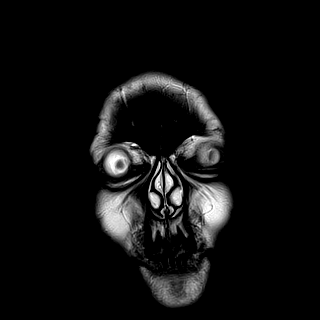

[Series 19: T1 post-contrast · coronal · 5.0mm · 0.34mm/px · 2 of 29 slices shown (1 of 2)]
[im 1/29]
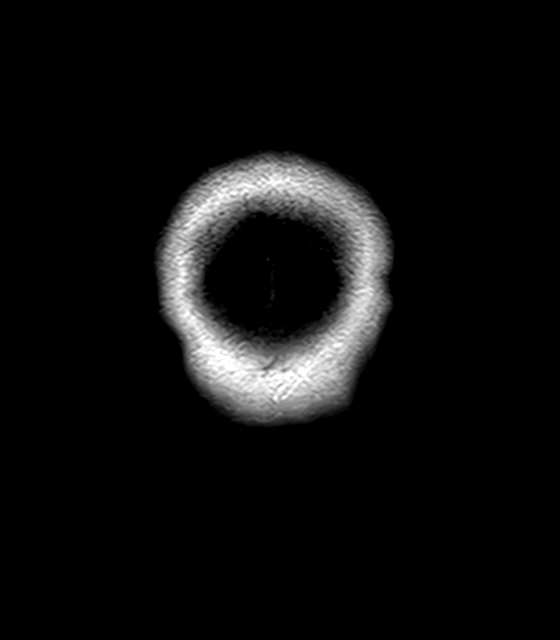
[im 29/29]
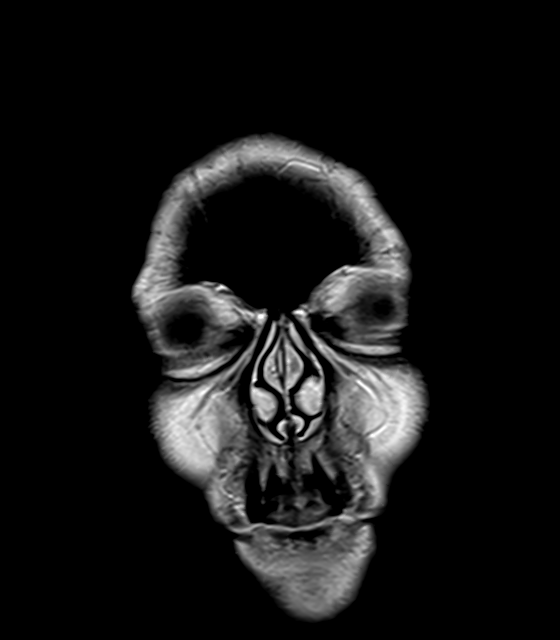

[Series 20: T1 post-contrast · sagittal · 5.0mm · 0.75mm/px · 2 of 25 slices shown (2 of 2)]
[im 1/25]
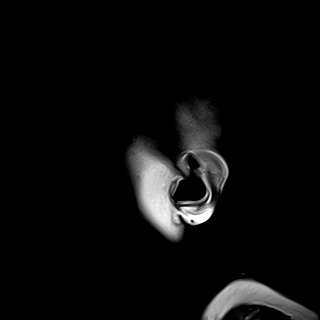
[im 25/25]
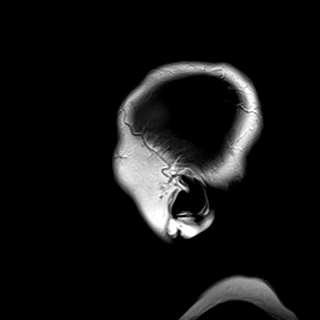

[40 of 48 positions shown; findings below may reference images not displayed]

FINDINGS: BRAIN: There is no acute infarct, acute hemorrhage or extra-axial
collection. The white matter signal is normal for the patient's age.
The cerebral and cerebellar volume are age-appropriate. There is no
hydrocephalus. The midline structures are normal.

VASCULAR: The major intracranial arterial and venous sinus flow
voids are normal. Susceptibility-sensitive sequences show no chronic
microhemorrhage or superficial siderosis.

SKULL AND UPPER CERVICAL SPINE: Calvarial bone marrow signal is
normal. There is no skull base mass. The visualized upper cervical
spine and soft tissues are normal.

SINUSES/ORBITS: There are no fluid levels or advanced mucosal
thickening. The mastoid air cells and middle ear cavities are free
of fluid. The orbits are normal.
IMPRESSION: Normal MRI of the brain.
